# Patient Record
Sex: Male | Born: 2000 | Race: Asian | Hispanic: No | Marital: Single | Smoking: Never smoker
Health system: Southern US, Community
[De-identification: ages and names within clinical notes are randomized; demographics above are authoritative.]

## PROBLEM LIST (undated history)

## (undated) DIAGNOSIS — M2550 Pain in unspecified joint: Secondary | ICD-10-CM

## (undated) DIAGNOSIS — R059 Cough, unspecified: Secondary | ICD-10-CM

## (undated) DIAGNOSIS — G43909 Migraine, unspecified, not intractable, without status migrainosus: Secondary | ICD-10-CM

## (undated) DIAGNOSIS — J209 Acute bronchitis, unspecified: Secondary | ICD-10-CM

## (undated) DIAGNOSIS — J069 Acute upper respiratory infection, unspecified: Secondary | ICD-10-CM

## (undated) DIAGNOSIS — F419 Anxiety disorder, unspecified: Secondary | ICD-10-CM

## (undated) DIAGNOSIS — R05 Cough: Secondary | ICD-10-CM

## (undated) HISTORY — DX: Pain in unspecified joint: M25.50

## (undated) HISTORY — DX: Acute upper respiratory infection, unspecified: J06.9

## (undated) HISTORY — DX: Cough, unspecified: R05.9

## (undated) HISTORY — DX: Cough: R05

## (undated) HISTORY — DX: Acute bronchitis, unspecified: J20.9

---
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
## 2019-01-08 DIAGNOSIS — R51 Headache: Secondary | ICD-10-CM | POA: Diagnosis not present

## 2019-01-08 DIAGNOSIS — R200 Anesthesia of skin: Secondary | ICD-10-CM | POA: Diagnosis not present

## 2019-01-31 DIAGNOSIS — R202 Paresthesia of skin: Secondary | ICD-10-CM | POA: Diagnosis not present

## 2019-01-31 DIAGNOSIS — R51 Headache: Secondary | ICD-10-CM | POA: Diagnosis not present

## 2019-01-31 DIAGNOSIS — R200 Anesthesia of skin: Secondary | ICD-10-CM | POA: Diagnosis not present

## 2019-02-07 NOTE — Telephone Encounter (Signed)
Message sent to provider 

## 2019-02-14 DIAGNOSIS — R200 Anesthesia of skin: Secondary | ICD-10-CM | POA: Diagnosis not present

## 2019-02-14 DIAGNOSIS — M79641 Pain in right hand: Secondary | ICD-10-CM | POA: Diagnosis not present

## 2019-02-14 DIAGNOSIS — G4452 New daily persistent headache (NDPH): Secondary | ICD-10-CM | POA: Diagnosis not present

## 2019-02-14 DIAGNOSIS — M79642 Pain in left hand: Secondary | ICD-10-CM | POA: Diagnosis not present

## 2019-02-15 ENCOUNTER — Other Ambulatory Visit: Payer: Self-pay | Admitting: Neurology

## 2019-02-15 DIAGNOSIS — G4452 New daily persistent headache (NDPH): Secondary | ICD-10-CM

## 2019-02-20 ENCOUNTER — Other Ambulatory Visit: Payer: Self-pay

## 2019-02-20 ENCOUNTER — Ambulatory Visit (HOSPITAL_COMMUNITY)
Admission: RE | Admit: 2019-02-20 | Discharge: 2019-02-20 | Disposition: A | Discharge status: Home / Self Care | Payer: No Typology Code available for payment source | Source: Ambulatory Visit | Attending: Neurology | Admitting: Neurology

## 2019-02-20 DIAGNOSIS — G4452 New daily persistent headache (NDPH): Secondary | ICD-10-CM | POA: Diagnosis not present

## 2019-02-20 DIAGNOSIS — R51 Headache: Secondary | ICD-10-CM | POA: Diagnosis not present

## 2019-04-22 DIAGNOSIS — R202 Paresthesia of skin: Secondary | ICD-10-CM | POA: Diagnosis not present

## 2019-04-22 DIAGNOSIS — R51 Headache: Secondary | ICD-10-CM | POA: Diagnosis not present

## 2019-04-22 DIAGNOSIS — R200 Anesthesia of skin: Secondary | ICD-10-CM | POA: Diagnosis not present

## 2019-05-27 ENCOUNTER — Encounter: Payer: Self-pay | Admitting: Family Medicine

## 2019-05-27 ENCOUNTER — Ambulatory Visit (INDEPENDENT_AMBULATORY_CARE_PROVIDER_SITE_OTHER): Payer: Medicaid Other | Admitting: Family Medicine

## 2019-05-27 ENCOUNTER — Other Ambulatory Visit: Payer: Self-pay

## 2019-05-27 VITALS — BP 120/68 | HR 89 | Temp 98.7°F | Ht 65.0 in | Wt 142.8 lb

## 2019-05-27 DIAGNOSIS — R202 Paresthesia of skin: Secondary | ICD-10-CM | POA: Diagnosis not present

## 2019-05-27 DIAGNOSIS — Z23 Encounter for immunization: Secondary | ICD-10-CM

## 2019-05-27 DIAGNOSIS — M791 Myalgia, unspecified site: Secondary | ICD-10-CM

## 2019-05-27 DIAGNOSIS — M2550 Pain in unspecified joint: Secondary | ICD-10-CM | POA: Diagnosis not present

## 2019-05-27 DIAGNOSIS — Z09 Encounter for follow-up examination after completed treatment for conditions other than malignant neoplasm: Secondary | ICD-10-CM

## 2019-05-27 DIAGNOSIS — R200 Anesthesia of skin: Secondary | ICD-10-CM

## 2019-05-27 NOTE — Progress Notes (Signed)
[facility]   Established Patient Office Visit  Subjective:  Patient ID: Phillip Hopkins, male    DOB: December 11, 2000  Age: 18 y.o. MRN: [id number]  CC:  Chief Complaint  Patient presents with  . Follow-up    6 month follow up     HPI Phillip BinetJunho Tates is a 18 year old male who presents for Follow Up today.   Past Medical History:  Diagnosis Date  . Acute bronchitis   . Cough   . Generalized joint pain   . Upper respiratory tract infection    Current Status: Since his last office visit, he has c/o generalized joint pain. He is currently seeing Neurologist. He continues to have throat irritation. He denies fevers, chills, fatigue, recent infections, weight loss, and night sweats. He has not had any headaches, visual changes, dizziness, and falls. No chest pain, heart palpitations, cough and shortness of breath reported. No reports of GI problems such as nausea, vomiting, diarrhea, and constipation. He has no reports of blood in stools, dysuria and hematuria. His anxiety is mild today. He denies suicidal ideations, homicidal ideations, or auditory hallucinations.  No past surgical history on file.  No family history on file.  Social History   Socioeconomic History  . Marital status: Single    Spouse name: Not on file  . Number of children: Not on file  . Years of education: Not on file  . Highest education level: Not on file  Occupational History  . Not on file  Social Needs  . Financial resource strain: Not on file  . Food insecurity    Worry: Not on file    Inability: Not on file  . Transportation needs    Medical: Not on file    Non-medical: Not on file  Tobacco Use  . Smoking status: Never Smoker  . Smokeless tobacco: Never Used  Substance and Sexual Activity  . Alcohol use: Never    Frequency: Never  . Drug use: Never  . Sexual activity: Not on file  Lifestyle  . Physical activity    Days per week: Not on file    Minutes per  session: Not on file  . Stress: Not on file  Relationships  . Social Musicianconnections    Talks on phone: Not on file    Gets together: Not on file    Attends religious service: Not on file    Active member of club or organization: Not on file    Attends meetings of clubs or organizations: Not on file    Relationship status: Not on file  . Intimate partner violence    Fear of current or ex partner: Not on file    Emotionally abused: Not on file    Physically abused: Not on file    Forced sexual activity: Not on file  Other Topics Concern  . Not on file  Social History Narrative  . Not on file    Outpatient Medications Prior to Visit  Medication Sig Dispense Refill  . nortriptyline (PAMELOR) 10 MG capsule Take 20 mg at night    . venlafaxine (EFFEXOR) 37.5 MG tablet Take by mouth.    Marland Kitchen. albuterol (PROVENTIL HFA;VENTOLIN HFA) 108 (90 Base) MCG/ACT inhaler 2 puffs.    . benzonatate (TESSALON) 100 MG capsule Take 1 capsule (100 mg total) by mouth 2 (two) times daily as needed for cough. (Patient not taking: Reported on 05/27/2019) 20 capsule 0  . cetirizine (ZYRTEC) 10 MG  tablet Take 10 mg by mouth.    Marland Kitchen. ibuprofen (ADVIL,MOTRIN) 800 MG tablet Take 1 tablet (800 mg total) by mouth every 8 (eight) hours as needed. (Patient not taking: Reported on 05/27/2019) 30 tablet 0  . montelukast (SINGULAIR) 10 MG tablet Take 10 mg by mouth.     No facility-administered medications prior to visit.     No Known Allergies  ROS Review of Systems  Constitutional: Negative.   HENT: Negative.   Eyes: Negative.   Respiratory: Negative.   Cardiovascular: Negative.   Gastrointestinal: Negative.   Endocrine: Negative.   Genitourinary: Negative.   Musculoskeletal: Positive for arthralgias (generalized joint pain).  Skin: Negative.   Allergic/Immunologic: Negative.   Neurological: Negative.   Hematological: Negative.   Psychiatric/Behavioral: Negative.    Objective:    Physical Exam  Constitutional:  He is oriented to person, place, and time. He appears well-developed and well-nourished.  HENT:  Head: Normocephalic and atraumatic.  Neck: Normal range of motion. Neck supple.  Cardiovascular: Normal rate, regular rhythm, normal heart sounds and intact distal pulses.  Pulmonary/Chest: Effort normal and breath sounds normal.  Abdominal: Soft. Bowel sounds are normal.  Musculoskeletal: Normal range of motion.  Neurological: He is alert and oriented to person, place, and time. He has normal reflexes.  Skin: Skin is dry.  Psychiatric: He has a normal mood and affect. His behavior is normal. Judgment and thought content normal.  Nursing note and vitals reviewed.   BP 120/68 (BP Location: Left Arm, Patient Position: Sitting, Cuff Size: Normal)   Pulse 89   Temp 98.7 F (37.1 C) (Oral)   Ht 5\' 5"  (1.651 m)   Wt 142 lb 12.8 oz (64.8 kg)   BMI 23.76 kg/m  Wt Readings from Last 3 Encounters:  05/27/19 142 lb 12.8 oz (64.8 kg) (32 %, Z= -0.47)*  11/26/18 146 lb (66.2 kg) (41 %, Z= -0.24)*  10/31/18 145 lb 12.8 oz (66.1 kg) (41 %, Z= -0.23)*   * Growth percentiles are based on CDC (Boys, 2-20 Years) data.     Health Maintenance Due  Topic Date Due  . HIV Screening  02/12/2015  . TETANUS/TDAP  02/12/2019    There are no preventive care reminders to display for this patient.  Lab Results  Component Value Date   TSH 4.400 11/19/2018   Lab Results  Component Value Date   WBC 4.6 10/26/2018   HGB 15.1 10/26/2018   HCT 44.2 10/26/2018   MCV 87 10/26/2018   PLT 253 10/26/2018   Lab Results  Component Value Date   NA 141 10/26/2018   K 4.5 10/26/2018   CO2 23 10/26/2018   GLUCOSE 93 10/26/2018   BUN 10 10/26/2018   CREATININE 0.94 10/26/2018   BILITOT 0.7 10/26/2018   ALKPHOS 80 10/26/2018   AST 32 10/26/2018   ALT 59 (H) 10/26/2018   PROT 7.2 10/26/2018   ALBUMIN 5.1 10/26/2018   CALCIUM 9.8 10/26/2018   No results found for: CHOL No results found for: HDL No  results found for: LDLCALC No results found for: TRIG No results found for: [facility] Lab Results  Component Value Date   HGBA1C 5.3 10/26/2018   Assessment & Plan:   1. Need for influenza vaccination - Flu Vaccine QUAD 6+ mos PF IM (Fluarix Quad PF)  2. Generalized joint pain  3. Generalized muscle ache  4. Numbness and tingling of left upper and lower extremity  5. Follow up He will follow up ijn 6 months.  No orders of the defined types were placed in this encounter.   Orders Placed This Encounter  Procedures  . Flu Vaccine QUAD 6+ mos PF IM (Fluarix Quad PF)    Referral Orders  No referral(s) requested today    Kathe Becton,  MSN, FNP-BC [facility], [place] [address] [phone] fax   Problem List Items Addressed This Visit      Other   Generalized joint pain   Generalized muscle ache   Numbness and tingling of left upper and lower extremity    Other Visit Diagnoses    Need for influenza vaccination    -  Primary   Relevant Orders   Flu Vaccine QUAD 6+ mos PF IM (Fluarix Quad PF) (Completed)   Follow up          No orders of the defined types were placed in this encounter.   Follow-up: Return in about 6 months (around 11/27/2019).    Azzie Glatter, FNP

## 2019-05-30 DIAGNOSIS — R768 Other specified abnormal immunological findings in serum: Secondary | ICD-10-CM | POA: Diagnosis not present

## 2019-05-30 DIAGNOSIS — R200 Anesthesia of skin: Secondary | ICD-10-CM | POA: Diagnosis not present

## 2019-05-30 DIAGNOSIS — R202 Paresthesia of skin: Secondary | ICD-10-CM | POA: Diagnosis not present

## 2019-06-03 DIAGNOSIS — R51 Headache: Secondary | ICD-10-CM | POA: Diagnosis not present

## 2019-06-03 DIAGNOSIS — R200 Anesthesia of skin: Secondary | ICD-10-CM | POA: Diagnosis not present

## 2019-06-03 DIAGNOSIS — R202 Paresthesia of skin: Secondary | ICD-10-CM | POA: Diagnosis not present

## 2019-06-20 ENCOUNTER — Telehealth: Payer: Self-pay

## 2019-06-20 NOTE — Telephone Encounter (Signed)
Received a call from pt ,He said that he got an call from  [place] [phone] and said for him to call us about setting up an appt for him at  [facility] because he has medicaid . Called spoke with [facility] neurology  Set up an appt for patient 08/14/19 @1pm  with Dr.Paul Aredes . Phone [phone]. [address] [facility] [address] [facility] [address] [place] . Pt is aware of this appt. Pt has also been add to waiting list, inform pt if this appt didn't work for him or if him wanted to change it to tele visit or e-visit to call their to do so.

## 2019-06-21 ENCOUNTER — Encounter: Payer: Self-pay | Admitting: Physical Therapy

## 2019-06-21 ENCOUNTER — Ambulatory Visit: Payer: Medicaid Other | Attending: Neurology | Admitting: Physical Therapy

## 2019-06-21 ENCOUNTER — Other Ambulatory Visit: Payer: Self-pay

## 2019-06-21 DIAGNOSIS — M542 Cervicalgia: Secondary | ICD-10-CM | POA: Insufficient documentation

## 2019-06-21 DIAGNOSIS — G8929 Other chronic pain: Secondary | ICD-10-CM | POA: Diagnosis not present

## 2019-06-21 DIAGNOSIS — M791 Myalgia, unspecified site: Secondary | ICD-10-CM | POA: Diagnosis not present

## 2019-06-21 DIAGNOSIS — R200 Anesthesia of skin: Secondary | ICD-10-CM | POA: Insufficient documentation

## 2019-06-21 DIAGNOSIS — R262 Difficulty in walking, not elsewhere classified: Secondary | ICD-10-CM | POA: Diagnosis not present

## 2019-06-21 DIAGNOSIS — M2550 Pain in unspecified joint: Secondary | ICD-10-CM

## 2019-06-21 DIAGNOSIS — R202 Paresthesia of skin: Secondary | ICD-10-CM | POA: Diagnosis not present

## 2019-06-21 NOTE — Therapy (Addendum)
[facility] [address] [place], [place], [address] Phone: [phone]   Fax:  [phone]  Physical Therapy Evaluation  Patient Details  Name: Phillip Hopkins MRN: [id number] Date of Birth: 2000-12-22 Referring Provider (PT): Dr. Melrose Nakayama   Encounter Date: 06/21/2019  PT End of Session - 06/26/19 1149    Visit Number  1    Number of Visits  1    Date for PT Re-Evaluation  06/21/19    PT Start Time  0846    PT Stop Time  1011    PT Time Calculation (min)  85 min       Past Medical History:  Diagnosis Date  . Acute bronchitis   . Cough   . Generalized joint pain   . Upper respiratory tract infection     History reviewed. No pertinent surgical history.  There were no vitals filed for this visit.                 Plan - 06/26/19 1151    Clinical Impression Statement  See FCE report.  Pt. presents with generalized pain in mulitple joints limiting all aspects of functional capacity evaluation.  Pt. reports 5-6/10 neck/ B shoulder pain prior to evalation with a marked increase to 8-9/10 pain in varying joints/ muscles as evaluation progresses with dynamic lifting/ position tolerance testing.  Pt. very pain focused/ limited during strength testing/ dynamic lifting tasks.  Pt. ambulates with a varying antalgic gait pattern t/o evaluation with increase R knee/L hip pain and flexed posture.  Due to limited nature and self-limiting behavior during test, unable to determine overall level of work or tolerance for an 8-hour day.    Stability/Clinical Decision Making  Unstable/Unpredictable    Clinical Decision Making  High    Rehab Potential  Fair    PT Frequency  1x / week    PT Treatment/Interventions  ADLs/Self Care Home Management;Gait training;Stair training;Functional mobility training;Therapeutic activities;Therapeutic exercise;Balance training;Neuromuscular re-education;Manual techniques;Passive range of motion    PT Next Visit  Plan  FCE only       Patient will benefit from skilled therapeutic intervention in order to improve the following deficits and impairments:  Abnormal gait, Decreased activity tolerance, Decreased endurance, Decreased range of motion, Decreased strength, Impaired UE functional use, Improper body mechanics, Postural dysfunction, Impaired flexibility, Pain, Decreased balance, Difficulty walking  Visit Diagnosis: Generalized muscle ache  Chronic neck pain  Generalized joint pain  Numbness and tingling of left upper and lower extremity  Difficulty in walking, not elsewhere classified     Problem List Patient Active Problem List   Diagnosis Date Noted  . Generalized muscle ache 11/26/2018  . Chronic neck pain 11/26/2018  . Generalized joint pain 10/31/2018  . Acute otitis media 10/31/2018  . Numbness and tingling of left upper and lower extremity 10/26/2018   Pura Spice, PT, DPT # [id number] 06/27/2019, 11:09 AM  [facility] [address] [place], [place], [address] Phone: [phone]   Fax:  [phone]  Name: [name] MRN: [id number] Date of Birth: 2001/05/16

## 2019-06-27 NOTE — Addendum Note (Signed)
Addended by: Dorcas Carrow C on: 06/27/2019 11:12 AM   Modules accepted: Orders

## 2019-07-02 DIAGNOSIS — G629 Polyneuropathy, unspecified: Secondary | ICD-10-CM | POA: Diagnosis not present

## 2019-07-02 DIAGNOSIS — R079 Chest pain, unspecified: Secondary | ICD-10-CM | POA: Diagnosis not present

## 2019-07-08 ENCOUNTER — Other Ambulatory Visit: Payer: Self-pay

## 2019-07-08 ENCOUNTER — Encounter (HOSPITAL_COMMUNITY): Payer: Self-pay | Admitting: *Deleted

## 2019-07-08 ENCOUNTER — Emergency Department (HOSPITAL_COMMUNITY)
Admission: EM | Admit: 2019-07-08 | Discharge: 2019-07-08 | Disposition: A | Discharge status: Home / Self Care | Payer: Medicaid Other | Attending: Emergency Medicine | Admitting: Emergency Medicine

## 2019-07-08 ENCOUNTER — Emergency Department (HOSPITAL_COMMUNITY): Payer: Medicaid Other

## 2019-07-08 DIAGNOSIS — G43009 Migraine without aura, not intractable, without status migrainosus: Secondary | ICD-10-CM | POA: Diagnosis not present

## 2019-07-08 DIAGNOSIS — R519 Headache, unspecified: Secondary | ICD-10-CM | POA: Diagnosis present

## 2019-07-08 DIAGNOSIS — R079 Chest pain, unspecified: Secondary | ICD-10-CM | POA: Diagnosis not present

## 2019-07-08 DIAGNOSIS — G43809 Other migraine, not intractable, without status migrainosus: Secondary | ICD-10-CM | POA: Diagnosis not present

## 2019-07-08 HISTORY — DX: Migraine, unspecified, not intractable, without status migrainosus: G43.909

## 2019-07-08 LAB — BASIC METABOLIC PANEL
Anion gap: 12 (ref 5–15)
BUN: 15 mg/dL (ref 6–20)
CO2: 25 mmol/L (ref 22–32)
Calcium: 9.7 mg/dL (ref 8.9–10.3)
Chloride: 102 mmol/L (ref 98–111)
Creatinine, Ser: 1.12 mg/dL (ref 0.61–1.24)
GFR calc Af Amer: >60 mL/min (ref >60)
GFR calc non Af Amer: >60 mL/min (ref >60)
Glucose, Bld: 91 mg/dL (ref 70–99)
Potassium: 4.3 mmol/L (ref 3.5–5.1)
Sodium: 139 mmol/L (ref 135–145)

## 2019-07-08 LAB — CBC
HCT: 49 % (ref 39.0–52.0)
Hemoglobin: 17 g/dL (ref 13.0–17.0)
MCH: 30.6 pg (ref 26.0–34.0)
MCHC: 34.7 g/dL (ref 30.0–36.0)
MCV: 88.3 fL (ref 80.0–100.0)
Platelets: 255 10*3/uL (ref 150–400)
RBC: 5.55 MIL/uL (ref 4.22–5.81)
RDW: 11.7 % (ref 11.5–15.5)
WBC: 9.3 10*3/uL (ref 4.0–10.5)
nRBC: 0 % (ref 0.0–0.2)

## 2019-07-08 LAB — TROPONIN I (HIGH SENSITIVITY): Troponin I (High Sensitivity): 3 ng/L (ref <18)

## 2019-07-08 MED ORDER — SODIUM CHLORIDE 0.9% FLUSH: 3.0000 mL | Freq: Once | INTRAVENOUS | Status: DC

## 2019-07-08 MED ORDER — DEXAMETHASONE 4 MG PO TABS
10.0000 mg | ORAL_TABLET | Freq: Once | ORAL | Status: AC
  Administered 2019-07-08: 10 mg via ORAL
  Filled 2019-07-08: qty 3

## 2019-07-08 MED ORDER — PROCHLORPERAZINE EDISYLATE 10 MG/2ML IJ SOLN
10.0000 mg | Freq: Once | INTRAMUSCULAR | Status: AC
  Administered 2019-07-08: 10 mg via INTRAMUSCULAR
  Filled 2019-07-08: qty 2

## 2019-07-08 MED ORDER — DIPHENHYDRAMINE HCL 50 MG/ML IJ SOLN
25.0000 mg | Freq: Once | INTRAMUSCULAR | Status: AC
  Administered 2019-07-08: 25 mg via INTRAMUSCULAR
  Filled 2019-07-08: qty 1

## 2019-07-08 NOTE — ED Triage Notes (Addendum)
Pt has been seeing a neurologist for several months for migraines and bil arm numbness.  He has tested neg for ms, etc.  Today he experienced chest pain and his arms went cold.  Episode lasted several minutes.  Pt is also having migraines.  He has taken all his migraine meds without any relief yesterday or today.

## 2019-07-08 NOTE — ED Provider Notes (Signed)
[facility] EMERGENCY DEPARTMENT Provider Note   CSN: [id number] Arrival date & time: 07/08/19  1416     History   Chief Complaint Chief Complaint  Patient presents with  . Chest Pain  . Dizziness    HPI Phillip Hopkins is a 18 y.o. male.     18 yo M with a chief complaint of a headache and bilateral arm tingling.  This been going on for about 3 days now.  Patient has a history of the same and has been seen by a neurologist and a rheumatologist.  He has had an extensive work-up that thus far has been unremarkable.  He thinks that the headache feels typical of his same however is lasted longer than normal.  Also feels that the tingling to his hands is lasted longer than normal.  He does have neck pain with this.  Denies any trauma to the head or neck.   Chest Pain Associated symptoms: dizziness   Associated symptoms: no abdominal pain, no fever, no headache, no palpitations, no shortness of breath and no vomiting   Dizziness Associated symptoms: chest pain   Associated symptoms: no diarrhea, no headaches, no palpitations, no shortness of breath and no vomiting     Past Medical History:  Diagnosis Date  . Acute bronchitis   . Cough   . Generalized joint pain   . Migraines   . Upper respiratory tract infection     Patient Active Problem List   Diagnosis Date Noted  . Generalized muscle ache 11/26/2018  . Chronic neck pain 11/26/2018  . Generalized joint pain 10/31/2018  . Acute otitis media 10/31/2018  . Numbness and tingling of left upper and lower extremity 10/26/2018    History reviewed. No pertinent surgical history.      Home Medications    Prior to Admission medications   Medication Sig Start Date End Date Taking? Authorizing Provider  nortriptyline (PAMELOR) 10 MG capsule Take 20 mg by mouth at bedtime.  04/22/19  Yes [provider]  venlafaxine (EFFEXOR) 37.5 MG tablet Take by mouth. 05/21/19 05/20/20 Yes [provider]    Family History No family history on file.  Social History Social History   Tobacco Use  . Smoking status: Never Smoker  . Smokeless tobacco: Never Used  Substance Use Topics  . Alcohol use: Never    Frequency: Never  . Drug use: Never     Allergies   Patient has no known allergies.   Review of Systems Review of Systems  Constitutional: Negative for chills and fever.  HENT: Negative for congestion and facial swelling.   Eyes: Negative for discharge and visual disturbance.  Respiratory: Negative for shortness of breath.   Cardiovascular: Positive for chest pain. Negative for palpitations.  Gastrointestinal: Negative for abdominal pain, diarrhea and vomiting.  Musculoskeletal: Positive for neck pain. Negative for arthralgias and myalgias.  Skin: Negative for color change and rash.  Neurological: Positive for dizziness. Negative for tremors, syncope and headaches.  Psychiatric/Behavioral: Negative for confusion and dysphoric mood.     Physical Exam Updated Vital Signs BP (!) 135/96 (BP Location: Right Arm)   Pulse 87   Temp 99.5 F (37.5 C) (Oral)   Resp (!) 108   Ht 5\' 5"  (1.651 m)   Wt 63.5 kg   SpO2 100%   BMI 23.30 kg/m   Physical Exam Vitals signs and nursing note reviewed.  Constitutional:      Appearance: He is well-developed.  HENT:  Head: Normocephalic and atraumatic.  Eyes:     Pupils: Pupils are equal, round, and reactive to light.  Neck:     Musculoskeletal: Normal range of motion and neck supple.     Vascular: No JVD.  Cardiovascular:     Rate and Rhythm: Normal rate and regular rhythm.     Heart sounds: No murmur. No friction rub. No gallop.   Pulmonary:     Effort: No respiratory distress.     Breath sounds: No wheezing.  Abdominal:     General: There is no distension.     Tenderness: There is no guarding or rebound.  Musculoskeletal: Normal range of motion.  Skin:    Coloration: Skin is not pale.     Findings: No rash.   Neurological:     Mental Status: He is alert and oriented to person, place, and time.     Cranial Nerves: Cranial nerves are intact.     Sensory: Sensation is intact.     Motor: Motor function is intact.     Coordination: Coordination is intact.     Gait: Gait is intact.     Comments: Benign neurologic exam.  Pulse motor and sensation are intact to bilateral upper extremities.  Psychiatric:        Behavior: Behavior normal.      ED Treatments / Results  Labs (all labs ordered are listed, but only abnormal results are displayed) Labs Reviewed  BASIC METABOLIC PANEL  CBC  TROPONIN I (HIGH SENSITIVITY)    EKG EKG Interpretation  Date/Time:  Monday July 08 2019 14:25:30 EDT Ventricular Rate:  105 PR Interval:  120 QRS Duration: 70 QT Interval:  304 QTC Calculation: 401 R Axis:   81 Text Interpretation:  Sinus tachycardia Otherwise normal ECG Since last tracing rate faster Otherwise no significant change Confirmed by Melene PlanFloyd, Lovely Kerins [phone]([id number]) on 07/08/2019 5:16:06 PM   Radiology Dg Chest 2 View  Result Date: 07/08/2019 CLINICAL DATA:  Chest pain. EXAM: CHEST - 2 VIEW COMPARISON:  None. FINDINGS: The heart size and mediastinal contours are within normal limits. Both lungs are clear. The visualized skeletal structures are unremarkable. IMPRESSION: No active cardiopulmonary disease. Electronically Signed   By: Gerome Samavid  Williams III M.D   On: 07/08/2019 14:48    Procedures Procedures (including critical care time)  Medications Ordered in ED Medications  sodium chloride flush (NS) 0.9 % injection 3 mL (3 mLs Intravenous Not Given 07/08/19 1804)  prochlorperazine (COMPAZINE) injection 10 mg (10 mg Intramuscular Given 07/08/19 1728)  diphenhydrAMINE (BENADRYL) injection 25 mg (25 mg Intramuscular Given 07/08/19 1728)  dexamethasone (DECADRON) tablet 10 mg (10 mg Oral Given 07/08/19 1727)     Initial Impression / Assessment and Plan / ED Course  I have reviewed the triage vital signs  and the nursing notes.  Pertinent labs & imaging results that were available during my care of the patient were reviewed by me and considered in my medical decision making (see chart for details).        18 yo M with a chief complaint of headaches and bilateral upper extremity tingling.  This is been an ongoing issue for him.  He [facility] neurolog[facility]  He is currently on amitriptyline and Effexor without improvement.  Denies any missed doses.  Denies new trauma.  He has no deficit that is appreciable on my exam.  He arrived complaining of some mild chest discomfort which typically coincides with his  headache symptoms.  Had a work-up ordered in triage that is thus far unremarkable.  Will give a headache cocktail and reassess.  Patient's laboratory work-up ordered in triage for chest pain is unremarkable.  Troponin is negative no electrolyte abnormality hemoglobin is normal.  Patient's headache and hand symptoms have improved significantly.  We will discharge the patient home.  The patient was tachycardic upon arrival which improved post headache cocktail.  The patient has a respiratory rate charted of 108 which is not correct.  My manual count of his respiratory rate is 13.  Discharge home.  6:44 PM:  I have discussed the diagnosis/risks/treatment options with the patient and believe the pt to be eligible for discharge home to follow-up with Neuro. We also discussed returning to the ED immediately if new or worsening sx occur. We discussed the sx which are most concerning (e.g., sudden worsening pain, fever, inability to tolerate by mouth) that necessitate immediate return. Medications administered to the patient during their visit and any new prescriptions provided to the patient are listed below.  Medications given during this visit Medications  sodium chloride flush (NS) 0.9 % injection 3 mL (3 mLs Intravenous Not Given 07/08/19 1804)  prochlorperazine  (COMPAZINE) injection 10 mg (10 mg Intramuscular Given 07/08/19 1728)  diphenhydrAMINE (BENADRYL) injection 25 mg (25 mg Intramuscular Given 07/08/19 1728)  dexamethasone (DECADRON) tablet 10 mg (10 mg Oral Given 07/08/19 1727)     The patient appears reasonably screen and/or stabilized for discharge and I doubt any other medical condition or other [facility] requiring further screening, evaluation, or treatment in the ED at this time prior to discharge.    Final Clinical Impressions(s) / ED Diagnoses   Final diagnoses:  Atypical migraine    ED Discharge Orders    None       Melene Plan, DO 07/08/19 1844

## 2019-07-08 NOTE — ED Notes (Signed)
Patient verbalizes understanding of discharge instructions. Opportunity for questioning and answers were provided. Armband removed by staff, pt discharged from ED.  

## 2019-07-08 NOTE — Discharge Instructions (Signed)
Please follow-up with your neurologist.  Return to the ED for worsening symptoms, fever.

## 2019-07-15 DIAGNOSIS — M542 Cervicalgia: Secondary | ICD-10-CM | POA: Diagnosis not present

## 2019-07-15 DIAGNOSIS — R200 Anesthesia of skin: Secondary | ICD-10-CM | POA: Diagnosis not present

## 2019-07-15 DIAGNOSIS — R202 Paresthesia of skin: Secondary | ICD-10-CM | POA: Diagnosis not present

## 2019-07-16 ENCOUNTER — Encounter: Payer: Self-pay | Admitting: Family Medicine

## 2019-07-16 ENCOUNTER — Other Ambulatory Visit: Payer: Self-pay | Admitting: Family Medicine

## 2019-07-16 DIAGNOSIS — F419 Anxiety disorder, unspecified: Secondary | ICD-10-CM

## 2019-07-25 ENCOUNTER — Telehealth: Payer: Self-pay

## 2019-07-25 NOTE — Telephone Encounter (Signed)
Patients Mother & Father are requesting an new referral for Rheumatology. They would like the son seen by Dr. Bo Merino. Will you please place the referral for STAT?  Per parents his symptoms are getting worse.   He is currently being seen at [facility]  in Rheumatology & Neurology.

## 2019-07-25 NOTE — Telephone Encounter (Signed)
Patient and Mother is aware of need for Phych follow up. They will call [facility] for follow up.

## 2019-07-29 ENCOUNTER — Ambulatory Visit (INDEPENDENT_AMBULATORY_CARE_PROVIDER_SITE_OTHER): Payer: Medicaid Other | Admitting: Licensed Clinical Social Worker

## 2019-07-29 ENCOUNTER — Other Ambulatory Visit: Payer: Self-pay

## 2019-07-29 ENCOUNTER — Encounter (HOSPITAL_COMMUNITY): Payer: Self-pay | Admitting: Licensed Clinical Social Worker

## 2019-07-29 DIAGNOSIS — R52 Pain, unspecified: Secondary | ICD-10-CM

## 2019-07-29 DIAGNOSIS — F0634 Mood disorder due to known physiological condition with mixed features: Secondary | ICD-10-CM | POA: Diagnosis not present

## 2019-07-29 NOTE — Progress Notes (Signed)
Virtual Visit via Video Note  I connected with Phillip Hopkins on 07/29/19 at 10:00 AM EDT by a video enabled telemedicine application and verified that I am speaking with the correct person using two identifiers.    I discussed the limitations of evaluation and management by telemedicine and the availability of in person appointments. The patient expressed understanding and agreed to proceed.    I discussed the assessment and treatment plan with the patient. The patient was provided an opportunity to ask questions and all were answered. The patient agreed with the plan and demonstrated an understanding of the instructions.   The patient was advised to call back or seek an in-person evaluation if the symptoms worsen or if the condition fails to improve as anticipated.  I provided 55 minutes of non-face-to-face time during this encounter.   Phillip Curling, LCSW   Comprehensive Clinical Assessment (CCA) Note  07/29/2019 Phillip Hopkins [id number]  Visit Diagnosis:      ICD-10-CM   1. 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  (See scanned document in Chart Review)  CCA Part Two A  Intake/Chief Complaint:  CCA Intake With Chief Complaint CCA Part Two Date: 07/29/19 CCA Part Two Time: 1005 Chief Complaint/Presenting Problem: Since January, had pain in limbs, pain behind eyes, neck, headaches. Patients Currently Reported Symptoms/Problems: feeling stressed in general, has been in pain and sick for almost a year. Pain gets worse if he thinks about it. Collateral Involvement: father, step mother, has some high school friends. Individual's Strengths: in college, logic based thinking, trustworthy, reliable, I help people by listening Individual's Preferences: plays guitar, piano, cycling Individual's Abilities: musical Initial Clinical Notes/Concerns: pain disorder, no diagnosis  as to why he experiences this pain Pain, dry cough, since November 2019.  Mental Health Symptoms Depression:  Depression: Fatigue, Difficulty Concentrating, Irritability, Sleep (too much or little), Hopelessness  Mania:  Mania: N/A  Anxiety:   Anxiety: Worrying, Sleep, Difficulty concentrating  Psychosis:  Psychosis: N/A  Trauma:  Trauma: N/A  Obsessions:  Obsessions: N/A  Compulsions:  Compulsions: N/A  Inattention:  Inattention: N/A  Hyperactivity/Impulsivity:  Hyperactivity/Impulsivity: N/A  Oppositional/Defiant Behaviors:  Oppositional/Defiant Behaviors: Easily annoyed  Borderline Personality:  Emotional Irregularity: N/A  Other Mood/Personality Symptoms:   only experiencing depression due to pain that is undiagnosed at this time.    Mental Status Exam Appearance and self-care  Stature:  Stature: Average  Weight:  Weight: Average weight  Clothing:  Clothing: Casual  Grooming:  Grooming: Normal  Cosmetic use:  Cosmetic Use: None  Posture/gait:  Posture/Gait: Normal  Motor activity:  Motor Activity: Slowed  Sensorium  Attention:  Attention: Normal  Concentration:  Concentration: Normal  Orientation:  Orientation: X5  Recall/memory:  Recall/Memory: Defective in immediate, Normal  Affect and Mood  Affect:  Affect: Depressed, Flat  Mood:  Mood: Depressed  Relating  Eye contact:  Eye Contact: Normal  Facial expression:  Facial Expression: Depressed  Attitude toward examiner:  Attitude Toward Examiner: Cooperative  Thought and Language  Speech flow: Speech Flow: Pressured, Soft  Thought content:  Thought Content: Appropriate to mood and circumstances  Preoccupation:   NA  Hallucinations:   NA  Organization:   logical  Transport planner of Knowledge:  Fund of Knowledge: Average  Intelligence:  Intelligence: Above Average  Abstraction:  Abstraction: Normal  Judgement:  Judgement:  Normal  Reality Testing:  Reality Testing: Realistic  Insight:  Insight: Good   Decision Making:  Decision Making: Normal  Social Functioning  Social Maturity:  Social Maturity: Responsible  Social Judgement:  Social Judgement: Normal  Stress  Stressors:  Stressors: Illness, Work(school, unable to do schoolwork because of pain issues)  Coping Ability:  Coping Ability: Building surveyorverwhelmed  Skill Deficits:   NA  Supports:   father, step mother   Family and Psychosocial History: Family history Marital status: Single Are you sexually active?: No What is your sexual orientation?: bisexual Has your sexual activity been affected by drugs, alcohol, medication, or emotional stress?: no Does patient have children?: No  Childhood History:  Childhood History By whom was/is the patient raised?: Both parents Additional childhood history information: Mother died in 2008 when [place] was 7. Step mom came into his life at age 89. Okay since 2014 with losing mom. Description of patient's relationship with caregiver when they were a child: close with parents, "normal relationship" Patient's description of current relationship with people who raised him/her: "normal", closer to father How were you disciplined when you got in trouble as a child/adolescent?: verbal, occasional smack on the wrist with a ruler Does patient have siblings?: Yes Number of Siblings: 1 Description of patient's current relationship with siblings: younger sister, age 764. She's okay, occasionally annoyed with her. Sometimes Florian BuffJunho will take care of her. Did patient suffer any verbal/emotional/physical/sexual abuse as a child?: No Did patient suffer from severe childhood neglect?: No Has patient ever been sexually abused/assaulted/raped as an adolescent or adult?: No Was the patient ever a victim of a crime or a disaster?: No Witnessed domestic violence?: No Has patient been effected by domestic violence as an adult?: No  CCA Part Two B  Employment/Work Situation: Employment / Work Psychologist, occupationalituation Employment situation:  Surveyor, mineralstudent Patient's job has been impacted by current illness: Yes Describe how patient's job has been impacted: school has been impacted, has to drop classed due to illness Did You Receive Any Psychiatric Treatment/Services While in the [place]. BancorpMilitary?: No Are There Guns or Other Weapons in Your Home?: No  Education: Engineer, civil (consulting)ducation School Currently Attending: [employer] Last Grade Completed: 12 Name of [employer]: [employer] Did Garment/textile technologistYou Graduate From [employer] School?: Yes Did Theme park managerYou Attend College?: Yes What Type of College Degree Do you Have?: currently a Printmakerfreshman at [employer] [place] Attend Graduate School?: No What Was Your Major?: Psychiatric nurseBiological Sciences, plans to go to Medical School Did You Have Any Special Interests In School?: no Did You Have An Individualized Education Program (IIEP): No Did You Have Any Difficulty At School?: No  Religion: Religion/Spirituality Are You A Religious Person?: No How Might This Affect Treatment?: it won't  Leisure/Recreation: Leisure / Recreation Leisure and Hobbies: music, cycling  Exercise/Diet: Exercise/Diet Do You Exercise?: Yes What Type of Exercise Do You Do?: Bike How Many Times a Week Do You Exercise?: 1-3 times a week Have You Gained or Lost A Significant Amount of Weight in the Past Six Months?: Yes-Lost Number of Pounds Lost?: 10(change in appetite over the past 2 months) Do You Follow a Special Diet?: No Do You Have Any Trouble Sleeping?: Yes Explanation of Sleeping Difficulties: trouble sleeping because of pain  CCA Part Two C  Alcohol/Drug Use: Alcohol / Drug Use Pain Medications: see MAR Prescriptions: see MAR Over the Counter: see MAR History of alcohol / drug use?: No history of alcohol / drug abuse  CCA Part Three  ASAM's:  Six Dimensions of Multidimensional Assessment  Dimension 1:  Acute Intoxication and/or Withdrawal Potential:     Dimension 2:  Biomedical Conditions and Complications:      Dimension 3:  Emotional, Behavioral, or Cognitive Conditions and Complications:     Dimension 4:  Readiness to Change:     Dimension 5:  Relapse, Continued use, or Continued Problem Potential:     Dimension 6:  Recovery/Living Environment:      Substance use Disorder (SUD)    Social Function:  Social Functioning Social Maturity: Responsible Social Judgement: Normal  Stress:  Stress Stressors: Illness, Work(school, unable to do schoolwork because of pain issues) Coping Ability: Overwhelmed Patient Takes Medications The Way The Doctor Instructed?: Yes Priority Risk: Low Acuity  Risk Assessment- Self-Harm Potential: Risk Assessment For Self-Harm Potential Thoughts of Self-Harm: No current thoughts Method: No plan Availability of Means: No access/NA  Risk Assessment -Dangerous to Others Potential: Risk Assessment For Dangerous to Others Potential Method: No Plan Availability of Means: No access or NA Intent: Vague intent or NA Notification Required: No need or identified person  DSM5 Diagnoses: Patient Active Problem List   Diagnosis Date Noted  . Generalized muscle ache 11/26/2018  . Chronic neck pain 11/26/2018  . Generalized joint pain 10/31/2018  . Acute otitis media 10/31/2018  . Numbness and tingling of left upper and lower extremity 10/26/2018    Patient Centered Plan: Patient is on the following Treatment Plan(s):  Depression  Recommendations for Services/Supports/Treatments: Recommendations for Services/Supports/Treatments Recommendations For Services/Supports/Treatments: Individual Therapy  Treatment Plan Summary: OP Treatment Plan Summary: "help me get through the pain and to cope with stress"  Referrals to Alternative Service(s): Referred to Alternative Service(s):   Place:   Date:   Time:    Referred to Alternative Service(s):   Place:   Date:   Time:    Referred to Alternative Service(s):   Place:   Date:   Time:    Referred to Alternative  Service(s):   Place:   Date:   Time:     Veneda Melter, LCSW

## 2019-08-05 ENCOUNTER — Telehealth: Payer: Self-pay | Admitting: Family Medicine

## 2019-08-05 DIAGNOSIS — R519 Headache, unspecified: Secondary | ICD-10-CM | POA: Diagnosis not present

## 2019-08-05 DIAGNOSIS — R200 Anesthesia of skin: Secondary | ICD-10-CM | POA: Diagnosis not present

## 2019-08-05 DIAGNOSIS — R202 Paresthesia of skin: Secondary | ICD-10-CM | POA: Diagnosis not present

## 2019-08-05 NOTE — Telephone Encounter (Signed)
error 

## 2019-08-14 DIAGNOSIS — R202 Paresthesia of skin: Secondary | ICD-10-CM | POA: Diagnosis not present

## 2019-08-19 ENCOUNTER — Ambulatory Visit (INDEPENDENT_AMBULATORY_CARE_PROVIDER_SITE_OTHER): Payer: Medicaid Other | Admitting: Licensed Clinical Social Worker

## 2019-08-19 ENCOUNTER — Other Ambulatory Visit: Payer: Self-pay

## 2019-08-19 ENCOUNTER — Encounter (HOSPITAL_COMMUNITY): Payer: Self-pay | Admitting: Licensed Clinical Social Worker

## 2019-08-19 DIAGNOSIS — F329 Major depressive disorder, single episode, unspecified: Secondary | ICD-10-CM

## 2019-08-19 DIAGNOSIS — F0634 Mood disorder due to known physiological condition with mixed features: Secondary | ICD-10-CM | POA: Diagnosis not present

## 2019-08-19 NOTE — Progress Notes (Signed)
Virtual Visit via Video Note  I connected with Phillip Hopkins on 08/19/19 at 10:00 AM EST by a video enabled telemedicine application and verified that I am speaking with the correct person using two identifiers.     I discussed the limitations of evaluation and management by telemedicine and the availability of in person appointments. The patient expressed understanding and agreed to proceed.  Type of Therapy: Individual Therapy  Treatment Goals addressed: "help me get through the pain and to cope with stress"  Interventions: Motivational Interviewing, CBT, and Other: Grounding and Mindfulness techniques  Summary: Phillip Hopkins is a 18 y.o. 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 Phillip Hopkins discussed his psychiatric symptoms and current life events. Phillip Hopkins shared ongoing frustration that he has not yet been diagnosed with anything explaining his physical pain, nausea, and lack of energy. He reports he spends a lot of time sleeping, due to pain and nausea medications. He also reports he has dropped several classes due to not being able to cope with that amount of work at this time. Clinician explored daily routine, sleep hygiene, and triggers to medical issues. Clinician encouraged Phillip Hopkins to continue documenting his daily sxs in order to track and report to doctors. Clinician explored use of coping skills and encouraged Phillip Hopkins to maintain basic levels of hygiene, movement, and nutrition.   Plan: Return again in 2-3 weeks.  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 The patient was provided an opportunity to ask questions and all were answered. The patient agreed with the plan and demonstrated an understanding of the  instructions.   The patient was advised to call back or seek an in-person evaluation if the symptoms worsen or if the condition fails to improve as anticipated.  I provided 45 minutes of non-face-to-face time during this encounter.   Mindi Curling, LCSW

## 2019-08-26 DIAGNOSIS — R200 Anesthesia of skin: Secondary | ICD-10-CM | POA: Diagnosis not present

## 2019-08-26 DIAGNOSIS — R202 Paresthesia of skin: Secondary | ICD-10-CM | POA: Diagnosis not present

## 2019-08-26 DIAGNOSIS — R519 Headache, unspecified: Secondary | ICD-10-CM | POA: Diagnosis not present

## 2019-09-12 ENCOUNTER — Ambulatory Visit (INDEPENDENT_AMBULATORY_CARE_PROVIDER_SITE_OTHER): Payer: Medicaid Other | Admitting: Licensed Clinical Social Worker

## 2019-09-12 ENCOUNTER — Encounter (HOSPITAL_COMMUNITY): Payer: Self-pay | Admitting: Licensed Clinical Social Worker

## 2019-09-12 ENCOUNTER — Other Ambulatory Visit: Payer: Self-pay

## 2019-09-12 DIAGNOSIS — G8929 Other chronic pain: Secondary | ICD-10-CM | POA: Diagnosis not present

## 2019-09-12 DIAGNOSIS — F0634 Mood disorder due to known physiological condition with mixed features: Secondary | ICD-10-CM

## 2019-09-12 DIAGNOSIS — F339 Major depressive disorder, recurrent, unspecified: Secondary | ICD-10-CM | POA: Diagnosis not present

## 2019-09-12 NOTE — Progress Notes (Signed)
Virtual Visit via Video Note  I connected with Phillip Hopkins on 09/12/19 at 11:00 AM EST by a video enabled telemedicine application and verified that I am speaking with the correct person using two identifiers.    I discussed the limitations of evaluation and management by telemedicine and the availability of in person appointments. The patient expressed understanding and agreed to proceed.  Type of Therapy: Individual Therapy  Treatment Goals addressed: "help me get through the pain and to cope with stress"  Interventions: Motivational Interviewing, CBT, and Other: Grounding and Mindfulness techniques  Summary: Phillip Hopkins is a 18 y.o. 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 Phillip Hopkins discussed his psychiatric symptoms and current life events. Phillip Hopkins shared ongoing pain and depressive sxs due to undiagnosed pain disorder. Clinician explored mood sxs as well as pain scale rating. Phillip Hopkins identified a current and average score of 6 on a pain scale. Clinician discussed movement and exercise, as well as time when he is sitting up and laying down. Clinician noted that sometimes laying down or sitting down too long can increase pain sxs. Clinician provided psychoeducation about radical acceptance of pain sxs as a way to accept and move forward. Clinician also provided psychoeducation about somatization and how emotional stress can manifest in physical pain. Clinician explored any residual or long term emotional stress. Phillip Hopkins identified some concerns about his sexuality and how his family would react if he was honest. Clinician utilized MI OARS to reflect and summarize thoughts and feelings. Clinician also normalized these issues and encouraged Phillip Hopkins to do what he felt was right at the time that works for him. Clinician explored other natural  supports and identified that he is talking to his friends about these issues.    Plan: Return again in 2-3 weeks.  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 The patient was provided an opportunity to ask questions and all were answered. The patient agreed with the plan and demonstrated an understanding of the instructions.   The patient was advised to call back or seek an in-person evaluation if the symptoms worsen or if the condition fails to improve as anticipated.  I provided 45 minutes of non-face-to-face time during this encounter.   Mindi Curling, LCSW

## 2019-10-15 ENCOUNTER — Other Ambulatory Visit: Payer: Self-pay

## 2019-10-15 ENCOUNTER — Ambulatory Visit (HOSPITAL_COMMUNITY): Payer: Medicaid Other | Admitting: Licensed Clinical Social Worker

## 2019-10-23 DIAGNOSIS — F0280 Dementia in other diseases classified elsewhere without behavioral disturbance: Secondary | ICD-10-CM | POA: Diagnosis not present

## 2019-10-23 DIAGNOSIS — G3183 Dementia with Lewy bodies: Secondary | ICD-10-CM | POA: Diagnosis not present

## 2019-10-23 DIAGNOSIS — R202 Paresthesia of skin: Secondary | ICD-10-CM | POA: Diagnosis not present

## 2019-11-27 ENCOUNTER — Ambulatory Visit: Payer: Medicaid Other | Admitting: Family Medicine

## 2019-11-29 ENCOUNTER — Telehealth: Payer: Self-pay | Admitting: Family Medicine

## 2019-11-29 NOTE — Telephone Encounter (Signed)
Pt was called and reminded of there appointment 

## 2019-12-02 ENCOUNTER — Ambulatory Visit (INDEPENDENT_AMBULATORY_CARE_PROVIDER_SITE_OTHER): Payer: Medicaid Other | Admitting: Family Medicine

## 2019-12-02 ENCOUNTER — Other Ambulatory Visit: Payer: Self-pay

## 2019-12-02 ENCOUNTER — Encounter: Payer: Self-pay | Admitting: Family Medicine

## 2019-12-02 VITALS — BP 117/82 | HR 101 | Temp 98.6°F | Resp 16 | Ht 65.0 in | Wt 139.0 lb

## 2019-12-02 DIAGNOSIS — Z09 Encounter for follow-up examination after completed treatment for conditions other than malignant neoplasm: Secondary | ICD-10-CM | POA: Diagnosis not present

## 2019-12-02 DIAGNOSIS — R200 Anesthesia of skin: Secondary | ICD-10-CM | POA: Diagnosis not present

## 2019-12-02 DIAGNOSIS — F419 Anxiety disorder, unspecified: Secondary | ICD-10-CM | POA: Diagnosis not present

## 2019-12-02 DIAGNOSIS — F329 Major depressive disorder, single episode, unspecified: Secondary | ICD-10-CM

## 2019-12-02 DIAGNOSIS — R202 Paresthesia of skin: Secondary | ICD-10-CM

## 2019-12-02 DIAGNOSIS — F32A Depression, unspecified: Secondary | ICD-10-CM | POA: Insufficient documentation

## 2019-12-02 NOTE — Progress Notes (Signed)
[facility]   Established Patient Office Visit  Subjective:  Patient ID: Phillip Hopkins, male    DOB: 2001-01-09  Age: 19 y.o. MRN: [id number]  CC:  Chief Complaint  Patient presents with  . Follow-up    HPI Dane Kopke is a 19 year old male who presents for Follow Up today.   Past Medical History:  Diagnosis Date  . Acute bronchitis   . Anxiety 2019  . Cough   . Generalized joint pain   . Migraines   . Upper respiratory tract infection    Current Status: Since his last office visit, he is doing well with no complaints. His anxiety is mild today. He states that he feels as though the is doing better and does not need He denies suicidal ideations, homicidal ideations, or auditory hallucinations. He denies fevers, chills, fatigue, recent infections, weight loss, and night sweats. He has not had any headaches, visual changes, dizziness, and falls. No chest pain, heart palpitations, cough and shortness of breath reported. No reports of GI problems such as nausea, vomiting, diarrhea, and constipation. He has no reports of blood in stools, dysuria and hematuria. He denies pain today.    History reviewed. No pertinent surgical history.  History reviewed. No pertinent family history.  Social History   Socioeconomic History  . Marital status: Single    Spouse name: Not on file  . Number of children: Not on file  . Years of education: Not on file  . Highest education level: Not on file  Occupational History  . Not on file  Tobacco Use  . Smoking status: Never Smoker  . Smokeless tobacco: Never Used  Substance and Sexual Activity  . Alcohol use: Never  . Drug use: Never  . Sexual activity: Not on file  Other Topics Concern  . Not on file  Social History Narrative  . Not on file   Social Determinants of Health   Financial Resource Strain:   . Difficulty of Paying Living Expenses: Not on file  Food Insecurity:   . Worried About  Programme researcher, broadcasting/film/video in the Last Year: Not on file  . Ran Out of Food in the Last Year: Not on file  Transportation Needs:   . Lack of Transportation (Medical): Not on file  . Lack of Transportation (Non-Medical): Not on file  Physical Activity:   . Days of Exercise per Week: Not on file  . Minutes of Exercise per Session: Not on file  Stress:   . Feeling of Stress : Not on file  Social Connections:   . Frequency of Communication with Friends and Family: Not on file  . Frequency of Social Gatherings with Friends and Family: Not on file  . Attends Religious Services: Not on file  . Active Member of Clubs or Organizations: Not on file  . Attends Banker Meetings: Not on file  . Marital Status: Not on file  Intimate Partner Violence:   . Fear of Current or Ex-Partner: Not on file  . Emotionally Abused: Not on file  . Physically Abused: Not on file  . Sexually Abused: Not on file    Outpatient Medications Prior to Visit  Medication Sig Dispense Refill  . nortriptyline (PAMELOR) 10 MG capsule Take 20 mg by mouth at bedtime.     Marland Kitchen venlafaxine (EFFEXOR) 37.5 MG tablet Take by mouth.     No facility-administered medications prior to visit.    No  Known Allergies  ROS Review of Systems  Constitutional: Negative.   HENT: Negative.   Eyes: Negative.   Respiratory: Negative.   Cardiovascular: Negative.   Gastrointestinal: Negative.   Endocrine: Negative.   Genitourinary: Negative.   Musculoskeletal: Negative.   Skin: Negative.   Allergic/Immunologic: Negative.   Neurological: Positive for numbness (upper extremities).  Hematological: Negative.   Psychiatric/Behavioral: Negative.       Objective:    Physical Exam  Constitutional: He is oriented to person, place, and time. He appears well-developed and well-nourished.  HENT:  Head: Normocephalic and atraumatic.  Eyes: Conjunctivae are normal.  Cardiovascular: Normal rate, regular rhythm, normal heart sounds  and intact distal pulses.  Pulmonary/Chest: Effort normal and breath sounds normal.  Abdominal: Soft. Bowel sounds are normal.  Musculoskeletal:        General: Normal range of motion.     Cervical back: Normal range of motion and neck supple.  Neurological: He is alert and oriented to person, place, and time. He has normal reflexes.  Skin: Skin is warm.  Psychiatric: He has a normal mood and affect. His behavior is normal. Judgment and thought content normal.  Nursing note and vitals reviewed.   BP 117/82 (BP Location: Right Arm, Patient Position: Sitting, Cuff Size: Normal)   Pulse (!) 101   Temp 98.6 F (37 C) (Oral)   Resp 16   Ht 5\' 5"  (1.651 m)   Wt 139 lb (63 kg)   SpO2 99%   BMI 23.13 kg/m  Wt Readings from Last 3 Encounters:  12/02/19 139 lb (63 kg) (24 %, Z= -0.72)*  07/08/19 140 lb (63.5 kg) (27 %, Z= -0.62)*  05/27/19 142 lb 12.8 oz (64.8 kg) (32 %, Z= -0.47)*   * Growth percentiles are based on CDC (Boys, 2-20 Years) data.     Health Maintenance Due  Topic Date Due  . HIV Screening  02/12/2015    There are no preventive care reminders to display for this patient.  Lab Results  Component Value Date   TSH 4.400 11/19/2018   Lab Results  Component Value Date   WBC 9.3 07/08/2019   HGB 17.0 07/08/2019   HCT 49.0 07/08/2019   MCV 88.3 07/08/2019   PLT 255 07/08/2019   Lab Results  Component Value Date   NA 139 07/08/2019   K 4.3 07/08/2019   CO2 25 07/08/2019   GLUCOSE 91 07/08/2019   BUN 15 07/08/2019   CREATININE 1.12 07/08/2019   BILITOT 0.7 10/26/2018   ALKPHOS 80 10/26/2018   AST 32 10/26/2018   ALT 59 (H) 10/26/2018   PROT 7.2 10/26/2018   ALBUMIN 5.1 10/26/2018   CALCIUM 9.7 07/08/2019   ANIONGAP 12 07/08/2019   No results found for: CHOL No results found for: HDL No results found for: LDLCALC No results found for: TRIG No results found for: CHOLHDL Lab Results  Component Value Date   HGBA1C 5.3 10/26/2018      Assessment &  Plan:   1. Numbness and tingling of left upper and lower extremity  2. Depressive disorder He will continue to follow up with [facility] as needed.   3. Anxiety  4. Follow up He will follow up as needed.   No orders of the defined types were placed in this encounter.   No orders of the defined types were placed in this encounter.   Referral Orders  No referral(s) requested today   [facility],  MSN, FNP-BC [place]  [facility] [address] [place], [place] [address] [phone]   Problem List Items Addressed This Visit      Other   Numbness and tingling of left upper and lower extremity - Primary    Other Visit Diagnoses    Depressive disorder       Anxiety       Follow up          No orders of the defined types were placed in this encounter.   Follow-up: Return in about 7 months (around 07/03/2020).    Azzie Glatter, FNP

## 2020-01-14 IMAGING — MR MRI HEAD WITHOUT CONTRAST
8 series · 30 of 48 positions shown · non-contrast
Comparison: None.

CLINICAL DATA: New daily headaches

EXAM:
MRI HEAD WITHOUT CONTRAST
TECHNIQUE: Multiplanar, multiecho pulse sequences of the brain and surrounding
structures were obtained without intravenous contrast.

[Series 3: DWI · axial · 3.0mm · 1.09mm/px · z∈[-23,+136]mm · 9 of 108 slices shown (1 of 2)]
[im 1/108]
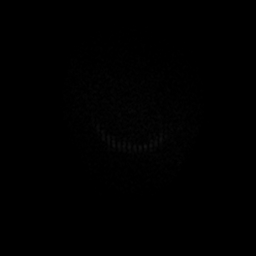
[im 18/108]
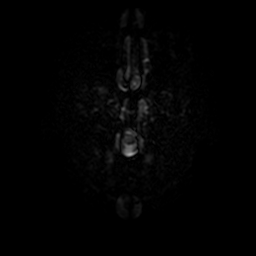
[im 36/108]
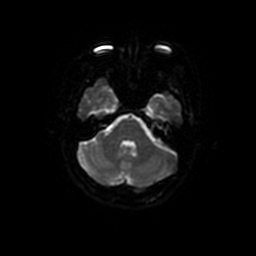
[im 45/108]
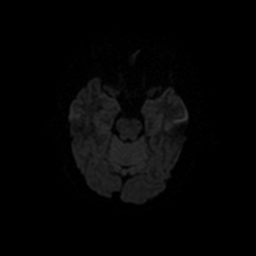
[im 54/108]
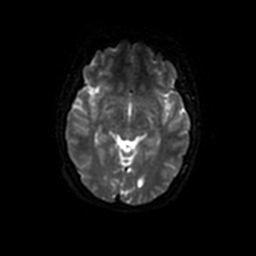
[im 63/108]
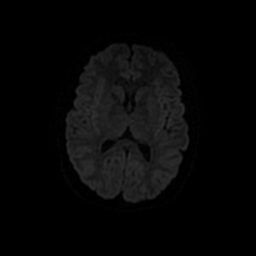
[im 72/108]
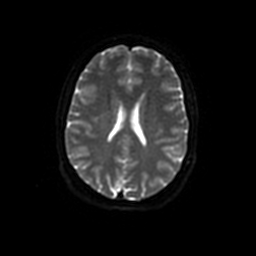
[im 90/108]
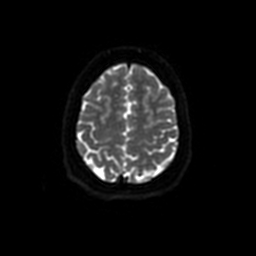
[im 108/108]
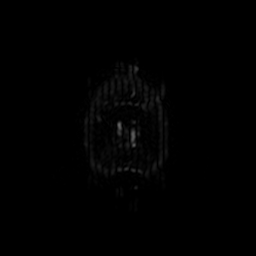

[Series 4: T1 · sagittal · 5.0mm · 0.47mm/px · 3 of 20 slices shown]
[im 1/20]
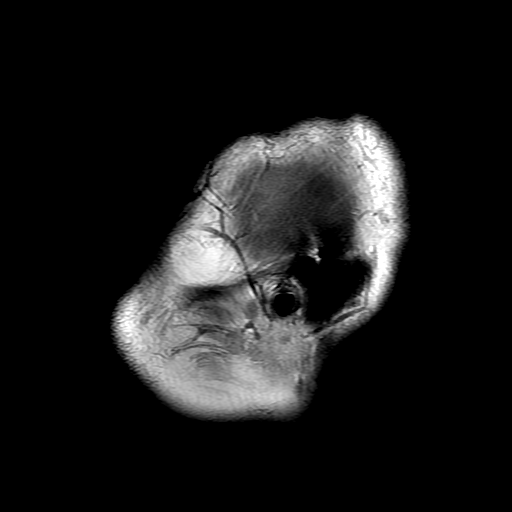
[im 10/20]
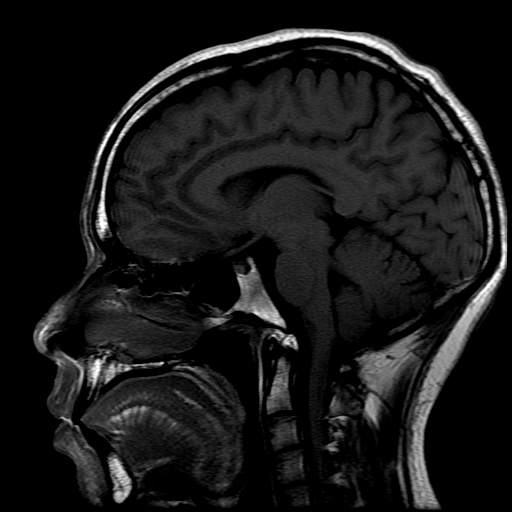
[im 20/20]
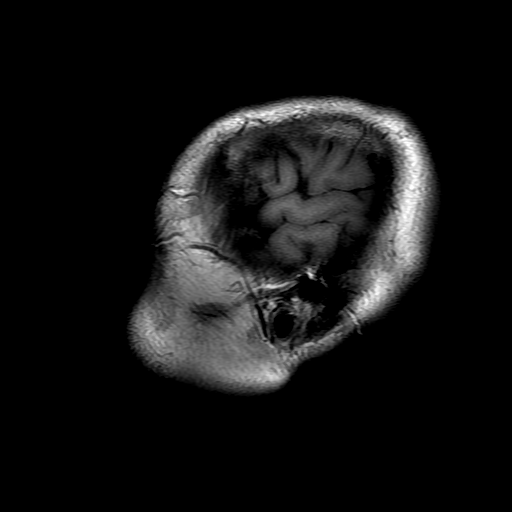

[Series 5: T2 · axial · 5.0mm · 0.43mm/px · z∈[-9,+138]mm · 2 of 22 slices shown (1 of 2)]
[im 1/22]
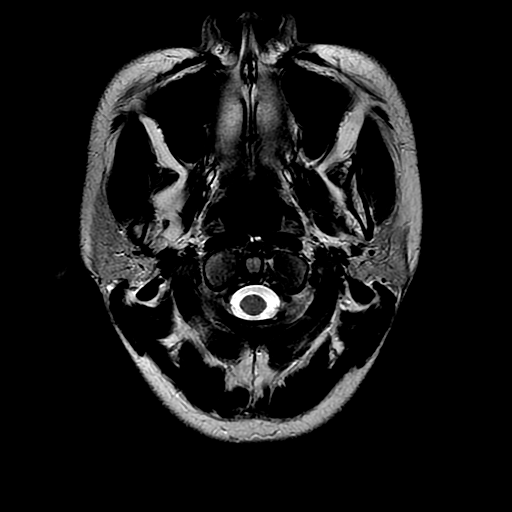
[im 22/22]
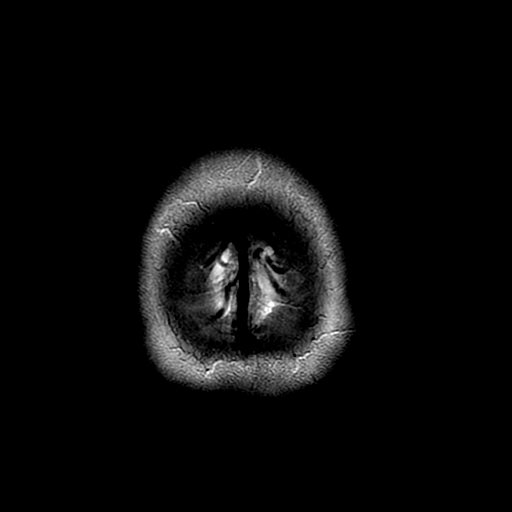

[Series 6: FLAIR · axial · 3.0mm · 0.43mm/px · z∈[-8,+141]mm · 3 of 26 slices shown]
[im 1/26]
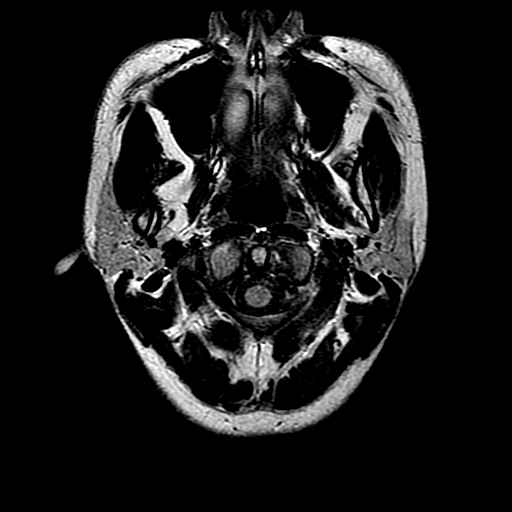
[im 13/26]
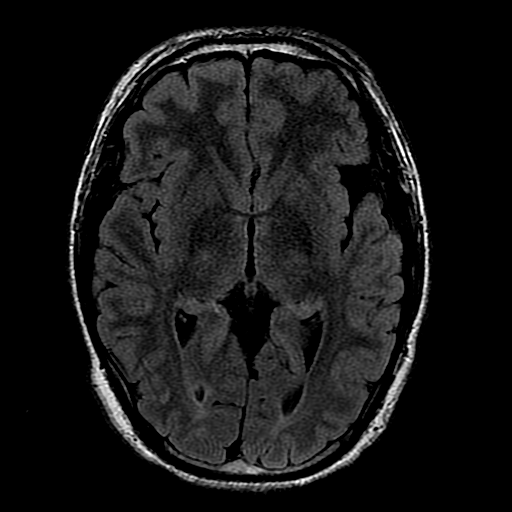
[im 26/26]
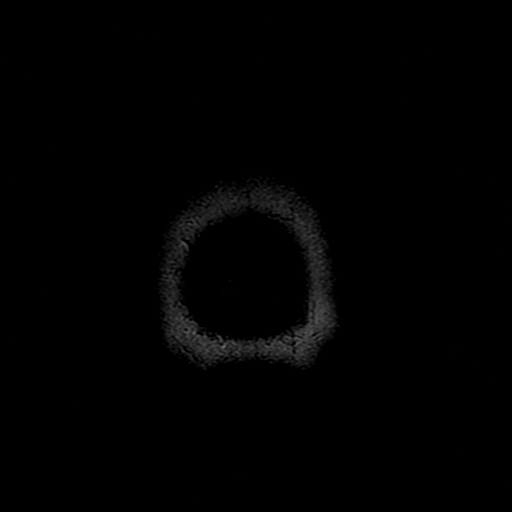

[Series 7: ax mpgr · axial · 3.0mm · 0.43mm/px · z∈[-6,+139]mm · 3 of 30 slices shown]
[im 1/30]
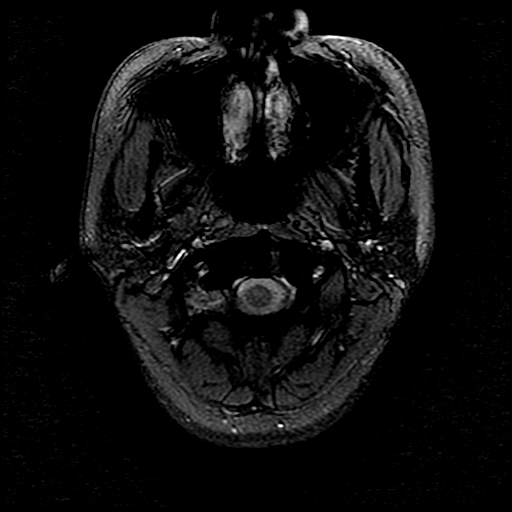
[im 15/30]
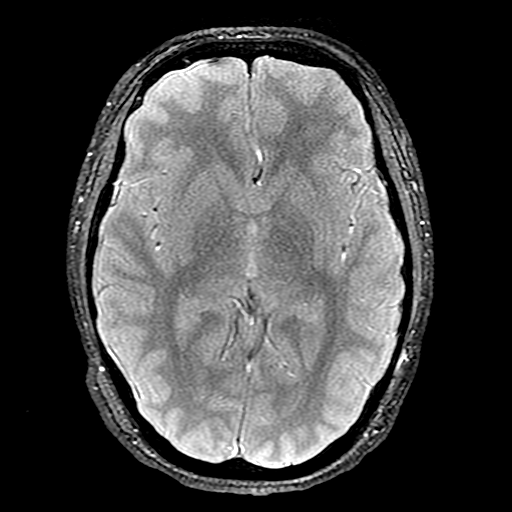
[im 30/30]
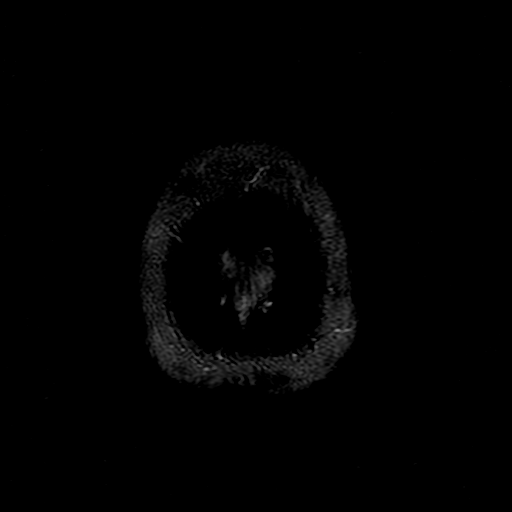

[Series 8: ax fspgr irp · axial · 1.0mm · 0.47mm/px · 1 of 136 slices shown]
[im 1/136]
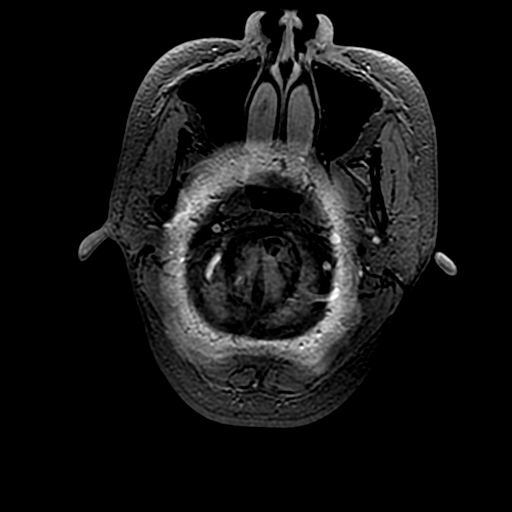

[Series 9: T2 · coronal · 5.0mm · 0.45mm/px · 3 of 29 slices shown (2 of 2)]
[im 1/29]
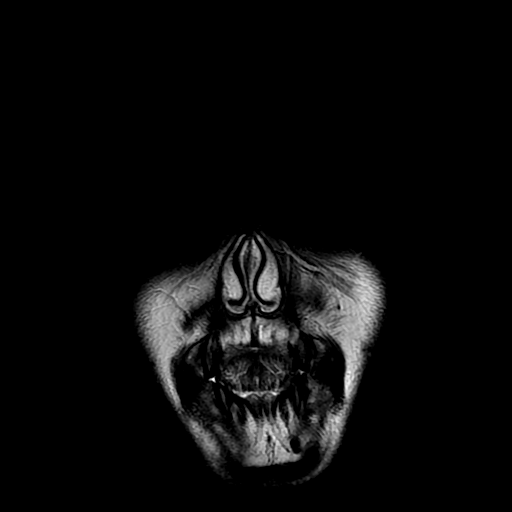
[im 15/29]
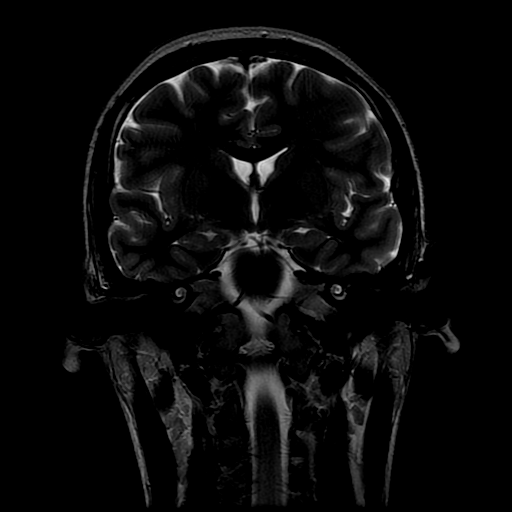
[im 29/29]
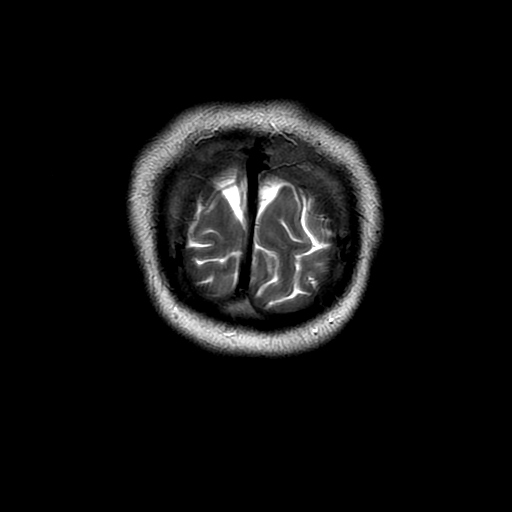

[Series 300: DWI · axial · 3.0mm · 1.09mm/px · z∈[-23,+136]mm · 6 of 54 slices shown (2 of 2)]
[im 1/54]
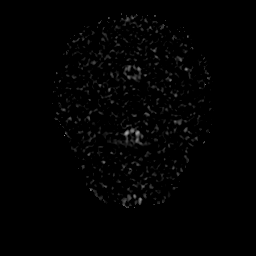
[im 11/54]
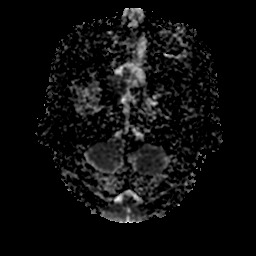
[im 22/54]
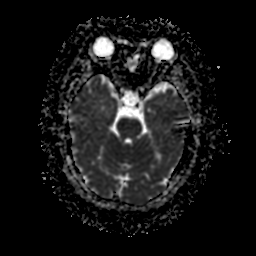
[im 32/54]
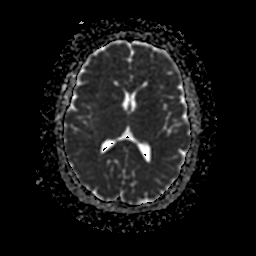
[im 43/54]
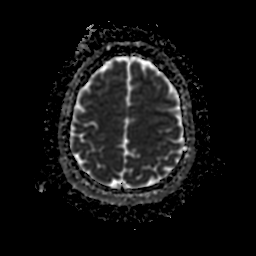
[im 54/54]
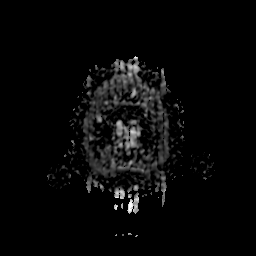

[30 of 48 positions shown; findings below may reference images not displayed]

FINDINGS: Brain: No acute infarction, hemorrhage, hydrocephalus, extra-axial
collection or mass lesion. Normal white matter

Vascular: Normal arterial flow voids.

Skull and upper cervical spine: Negative

Sinuses/Orbits: Negative

Other: None
IMPRESSION: Normal MRI brain

## 2020-07-03 ENCOUNTER — Other Ambulatory Visit: Payer: Self-pay

## 2020-07-03 ENCOUNTER — Encounter: Payer: Self-pay | Admitting: Family Medicine

## 2020-07-03 ENCOUNTER — Ambulatory Visit (INDEPENDENT_AMBULATORY_CARE_PROVIDER_SITE_OTHER): Payer: Medicaid Other | Admitting: Family Medicine

## 2020-07-03 VITALS — BP 116/74 | HR 61 | Temp 98.7°F | Ht 65.0 in | Wt 139.2 lb

## 2020-07-03 DIAGNOSIS — Z5289 Donor of other specified organs or tissues: Secondary | ICD-10-CM | POA: Diagnosis not present

## 2020-07-03 DIAGNOSIS — M2550 Pain in unspecified joint: Secondary | ICD-10-CM

## 2020-07-03 DIAGNOSIS — F329 Major depressive disorder, single episode, unspecified: Secondary | ICD-10-CM | POA: Diagnosis not present

## 2020-07-03 DIAGNOSIS — Z0000 Encounter for general adult medical examination without abnormal findings: Secondary | ICD-10-CM

## 2020-07-03 DIAGNOSIS — F32A Depression, unspecified: Secondary | ICD-10-CM

## 2020-07-03 DIAGNOSIS — F419 Anxiety disorder, unspecified: Secondary | ICD-10-CM | POA: Diagnosis not present

## 2020-07-03 DIAGNOSIS — Z09 Encounter for follow-up examination after completed treatment for conditions other than malignant neoplasm: Secondary | ICD-10-CM | POA: Diagnosis not present

## 2020-07-03 LAB — POCT URINALYSIS DIPSTICK
Bilirubin, UA: NEGATIVE
Blood, UA: NEGATIVE
Glucose, UA: NEGATIVE
Ketones, UA: NEGATIVE
Leukocytes, UA: NEGATIVE
Nitrite, UA: NEGATIVE
Protein, UA: NEGATIVE
Spec Grav, UA: 1.02 (ref 1.010–1.025)
Urobilinogen, UA: 0.2 E.U./dL
pH, UA: 6.5 (ref 5.0–8.0)

## 2020-07-03 NOTE — Progress Notes (Signed)
[facility]   Annual Physical  Subjective:  Patient ID: Phillip Hopkins, male    DOB: 2001/02/05  Age: 19 y.o. MRN: [id number]  CC:  Chief Complaint  Patient presents with  . Follow-up    Pt states he would like a referral to [facility]. Pt states he would like to freeze. his sperm.    HPI Phillip Hopkins is a 19 year old male who presents for Follow Up today.   Patient Active Problem List   Diagnosis Date Noted  . Depressive disorder 12/02/2019  . Anxiety 12/02/2019  . Generalized muscle ache 11/26/2018  . Chronic neck pain 11/26/2018  . Generalized joint pain 10/31/2018  . Acute otitis media 10/31/2018  . Numbness and tingling of left upper and lower extremity 10/26/2018   Current Status: Since his last office visit, he is doing well with no complaints. He has not continued to follow up with Milus Glazier, LCSW for Depressive Disorder,as he states that he no longer needs Psychiatry. His last follow up was 09/11/2020. Since then, he has not had any symptoms and feels that he does not need councilor at this time. His depression and anxiety is stable today. He denies suicidal ideations, homicidal ideations, or auditory hallucinations. 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 He is requesting to have his sperm 'frozen." He denies fevers, chills, fatigue, recent infections, weight loss, and night sweats. He has not had any headaches, visual changes, dizziness, and falls. No chest pain, heart palpitations, cough and shortness of breath reported. Denies GI problems such as nausea, vomiting, diarrhea, and constipation. He has no reports of blood in stools, dysuria and hematuria. He is taking all medications as prescribed.   Past Medical History:  Diagnosis Date  . Acute bronchitis   . Anxiety 2019  . Cough   . Generalized joint pain   . Migraines   . Upper respiratory tract infection     No past surgical  history on file.  No family history on file.  Social History   Socioeconomic History  . Marital status: Single    Spouse name: Not on file  . Number of children: Not on file  . Years of education: Not on file  . Highest education level: Not on file  Occupational History  . Not on file  Tobacco Use  . Smoking status: Never Smoker  . Smokeless tobacco: Never Used  Substance and Sexual Activity  . Alcohol use: Never  . Drug use: Never  . Sexual activity: Not on file  Other Topics Concern  . Not on file  Social History Narrative  . Not on file   Social Determinants of Health   Financial Resource Strain:   . Difficulty of Paying Living Expenses: Not on file  Food Insecurity:   . Worried About Programme researcher, broadcasting/film/video in the Last Year: Not on file  . Ran Out of Food in the Last Year: Not on file  Transportation Needs:   . Lack of Transportation (Medical): Not on file  . Lack of Transportation (Non-Medical): Not on file  Physical Activity:   . Days of Exercise per Week: Not on file  . Minutes of Exercise per Session: Not on file  Stress:   . Feeling of Stress : Not on file  Social Connections:   . Frequency of Communication with Friends and Family: Not on file  . Frequency of Social Gatherings with  Friends and Family: Not on file  . Attends Religious Services: Not on file  . Active Member of Clubs or Organizations: Not on file  . Attends Banker Meetings: Not on file  . Marital Status: Not on file  Intimate Partner Violence:   . Fear of Current or Ex-Partner: Not on file  . Emotionally Abused: Not on file  . Physically Abused: Not on file  . Sexually Abused: Not on file    Outpatient Medications Prior to Visit  Medication Sig Dispense Refill  . nortriptyline (PAMELOR) 10 MG capsule Take 20 mg by mouth at bedtime.  (Patient not taking: Reported on 07/03/2020)    . venlafaxine (EFFEXOR) 37.5 MG tablet Take by mouth. (Patient not taking: Reported on 07/03/2020)      No facility-administered medications prior to visit.    No Known Allergies  ROS Review of Systems  Constitutional: Negative.   HENT: Negative.   Eyes: Negative.   Respiratory: Negative.   Cardiovascular: Negative.   Gastrointestinal: Negative.   Endocrine: Negative.   Genitourinary: Negative.   Musculoskeletal: Negative.   Skin: Negative.   Allergic/Immunologic: Negative.   Neurological: Negative.   Hematological: Negative.   Psychiatric/Behavioral: Negative.       Objective:    Physical Exam Vitals and nursing note reviewed.  Constitutional:      Appearance: Normal appearance.  HENT:     Head: Normocephalic and atraumatic.     Nose: Nose normal.     Mouth/Throat:     Mouth: Mucous membranes are moist.     Pharynx: Oropharynx is clear.  Cardiovascular:     Rate and Rhythm: Normal rate and regular rhythm.     Pulses: Normal pulses.     Heart sounds: Normal heart sounds.  Pulmonary:     Effort: Pulmonary effort is normal.     Breath sounds: Normal breath sounds.  Abdominal:     General: Abdomen is flat. Bowel sounds are normal.     Palpations: Abdomen is soft.  Musculoskeletal:        General: Normal range of motion.     Cervical back: Normal range of motion and neck supple.  Skin:    General: Skin is warm and dry.  Neurological:     Mental Status: He is alert and oriented to person, place, and time.  Psychiatric:        Mood and Affect: Mood normal.        Behavior: Behavior normal.        Thought Content: Thought content normal.        Judgment: Judgment normal.     BP 116/74 (BP Location: Left Arm, Patient Position: Sitting, Cuff Size: Normal)   Pulse 61   Temp 98.7 F (37.1 C)   Ht 5\' 5"  (1.651 m)   Wt 139 lb 3.2 oz (63.1 kg)   SpO2 98%   BMI 23.16 kg/m  Wt Readings from Last 3 Encounters:  07/03/20 139 lb 3.2 oz (63.1 kg)  12/02/19 139 lb (63 kg) (24 %, Z= -0.72)*  07/08/19 140 lb (63.5 kg) (27 %, Z= -0.62)*   * Growth percentiles are  based on CDC (Boys, 2-20 Years) data.     Health Maintenance Due  Topic Date Due  . Hepatitis C Screening  Never done  . HIV Screening  Never done    There are no preventive care reminders to display for this patient.  Lab Results  Component Value Date   TSH 2.900 07/03/2020  Lab Results  Component Value Date   WBC 5.0 07/03/2020   HGB 14.9 07/03/2020   HCT 44.0 07/03/2020   MCV 89 07/03/2020   PLT 236 07/03/2020   Lab Results  Component Value Date   NA 138 07/03/2020   K 3.9 07/03/2020   CO2 22 07/03/2020   GLUCOSE 98 07/03/2020   BUN 14 07/03/2020   CREATININE 0.99 07/03/2020   BILITOT 0.6 07/03/2020   ALKPHOS 75 07/03/2020   AST 22 07/03/2020   ALT 21 07/03/2020   PROT 7.4 07/03/2020   ALBUMIN 5.1 07/03/2020   CALCIUM 9.5 07/03/2020   ANIONGAP 12 07/08/2019   No results found for: CHOL No results found for: HDL No results found for: LDLCALC No results found for: TRIG No results found for: CHOLHDL Lab Results  Component Value Date   HGBA1C 5.3 10/26/2018   Assessment & Plan:   1. Annual physical exam Physical assessment within normal for age. Recommend monthly testicular exams.  Recommend daily multivitamin.  Recommend strength training in 150 minutes of cardiovascular exercise per week  2. Generalized joint pain  3. Depressive disorder Stable today.   4. Anxiety  5. Sperm donor Referral sent to [facility] for consultation.  - Ambulatory referral to Infertility  6. Healthcare maintenance - CBC with Differential - Comprehensive metabolic panel - TSH - Vitamin B12 - Vitamin D, 25-hydroxy  7. Follow up He will follow up as needed.  He will possible transfer to PCP near [place] where he currently lives and attends [employer].    No orders of the defined types were placed in this encounter.   Orders Placed This Encounter  Procedures  . CBC with Differential  . Comprehensive metabolic panel  . TSH  . Vitamin B12    . Vitamin D, 25-hydroxy  . Ambulatory referral to Infertility  . Urinalysis Dipstick     Referral Orders     Ambulatory referral to Infertility  Raliegh Ip,  MSN, FNP-BC [facility] [address] [place], [place] [address] [phone] fax  Problem List Items Addressed This Visit      Other   Anxiety   Depressive disorder   Generalized joint pain    Other Visit Diagnoses    Annual physical exam    -  Primary   Sperm donor       Relevant Orders   Ambulatory referral to Infertility   Healthcare maintenance       Relevant Orders   CBC with Differential (Completed)   Comprehensive metabolic panel (Completed)   TSH (Completed)   Vitamin B12 (Completed)   Vitamin D, 25-hydroxy (Completed)   Urinalysis Dipstick (Completed)   Follow up          No orders of the defined types were placed in this encounter.   Follow-up: Return in about 1 year (around 07/03/2021).    Kallie Locks, FNP

## 2020-07-04 LAB — CBC WITH DIFFERENTIAL/PLATELET
Basophils Absolute: 0 10*3/uL (ref 0.0–0.2)
Basos: 1 %
EOS (ABSOLUTE): 0.1 10*3/uL (ref 0.0–0.4)
Eos: 1 %
Hematocrit: 44 % (ref 37.5–51.0)
Hemoglobin: 14.9 g/dL (ref 13.0–17.7)
Immature Grans (Abs): 0 10*3/uL (ref 0.0–0.1)
Immature Granulocytes: 0 %
Lymphocytes Absolute: 1.5 10*3/uL (ref 0.7–3.1)
Lymphs: 31 %
MCH: 30.3 pg (ref 26.6–33.0)
MCHC: 33.9 g/dL (ref 31.5–35.7)
MCV: 89 fL (ref 79–97)
Monocytes Absolute: 0.4 10*3/uL (ref 0.1–0.9)
Monocytes: 8 %
Neutrophils Absolute: 2.9 10*3/uL (ref 1.4–7.0)
Neutrophils: 59 %
Platelets: 236 10*3/uL (ref 150–450)
RBC: 4.92 x10E6/uL (ref 4.14–5.80)
RDW: 12.3 % (ref 11.6–15.4)
WBC: 5 10*3/uL (ref 3.4–10.8)

## 2020-07-04 LAB — COMPREHENSIVE METABOLIC PANEL
ALT: 21 IU/L (ref 0–44)
AST: 22 IU/L (ref 0–40)
Albumin/Globulin Ratio: 2.2 (ref 1.2–2.2)
Albumin: 5.1 g/dL (ref 4.1–5.2)
Alkaline Phosphatase: 75 IU/L (ref 51–125)
BUN/Creatinine Ratio: 14 (ref 9–20)
BUN: 14 mg/dL (ref 6–20)
Bilirubin Total: 0.6 mg/dL (ref 0.0–1.2)
CO2: 22 mmol/L (ref 20–29)
Calcium: 9.5 mg/dL (ref 8.7–10.2)
Chloride: 100 mmol/L (ref 96–106)
Creatinine, Ser: 0.99 mg/dL (ref 0.76–1.27)
GFR calc Af Amer: 126 mL/min/{1.73_m2} (ref >59)
GFR calc non Af Amer: 109 mL/min/{1.73_m2} (ref >59)
Globulin, Total: 2.3 g/dL (ref 1.5–4.5)
Glucose: 98 mg/dL (ref 65–99)
Potassium: 3.9 mmol/L (ref 3.5–5.2)
Sodium: 138 mmol/L (ref 134–144)
Total Protein: 7.4 g/dL (ref 6.0–8.5)

## 2020-07-04 LAB — TSH: TSH: 2.9 u[IU]/mL (ref 0.450–4.500)

## 2020-07-04 LAB — VITAMIN B12: Vitamin B-12: 445 pg/mL (ref 232–1245)

## 2020-07-04 LAB — VITAMIN D 25 HYDROXY (VIT D DEFICIENCY, FRACTURES): Vit D, 25-Hydroxy: 32.5 ng/mL (ref 30.0–100.0)

## 2020-07-06 ENCOUNTER — Encounter: Payer: Self-pay | Admitting: Family Medicine

## 2020-07-28 DIAGNOSIS — W19XXXA Unspecified fall, initial encounter: Secondary | ICD-10-CM | POA: Diagnosis not present

## 2020-07-28 DIAGNOSIS — R52 Pain, unspecified: Secondary | ICD-10-CM | POA: Diagnosis not present

## 2020-07-28 DIAGNOSIS — G4489 Other headache syndrome: Secondary | ICD-10-CM | POA: Diagnosis not present

## 2020-07-28 DIAGNOSIS — I10 Essential (primary) hypertension: Secondary | ICD-10-CM | POA: Diagnosis not present

## 2020-07-28 DIAGNOSIS — M25511 Pain in right shoulder: Secondary | ICD-10-CM | POA: Diagnosis not present

## 2020-07-28 DIAGNOSIS — M791 Myalgia, unspecified site: Secondary | ICD-10-CM | POA: Diagnosis not present

## 2020-07-28 DIAGNOSIS — M79662 Pain in left lower leg: Secondary | ICD-10-CM | POA: Diagnosis not present

## 2020-07-28 DIAGNOSIS — M542 Cervicalgia: Secondary | ICD-10-CM | POA: Diagnosis not present

## 2020-07-28 DIAGNOSIS — R531 Weakness: Secondary | ICD-10-CM | POA: Diagnosis not present

## 2020-09-01 DIAGNOSIS — R202 Paresthesia of skin: Secondary | ICD-10-CM | POA: Diagnosis not present

## 2020-09-01 DIAGNOSIS — M791 Myalgia, unspecified site: Secondary | ICD-10-CM | POA: Diagnosis not present

## 2020-09-01 DIAGNOSIS — M2550 Pain in unspecified joint: Secondary | ICD-10-CM | POA: Diagnosis not present

## 2020-09-11 DIAGNOSIS — M542 Cervicalgia: Secondary | ICD-10-CM | POA: Diagnosis not present

## 2020-09-11 DIAGNOSIS — M2550 Pain in unspecified joint: Secondary | ICD-10-CM | POA: Diagnosis not present

## 2020-09-11 DIAGNOSIS — R202 Paresthesia of skin: Secondary | ICD-10-CM | POA: Diagnosis not present

## 2020-09-11 DIAGNOSIS — M791 Myalgia, unspecified site: Secondary | ICD-10-CM | POA: Diagnosis not present

## 2020-10-05 DIAGNOSIS — M79601 Pain in right arm: Secondary | ICD-10-CM | POA: Diagnosis not present

## 2020-10-05 DIAGNOSIS — R202 Paresthesia of skin: Secondary | ICD-10-CM | POA: Diagnosis not present

## 2020-10-05 DIAGNOSIS — M79604 Pain in right leg: Secondary | ICD-10-CM | POA: Diagnosis not present

## 2020-10-07 DIAGNOSIS — M2550 Pain in unspecified joint: Secondary | ICD-10-CM | POA: Diagnosis not present

## 2020-10-07 DIAGNOSIS — M791 Myalgia, unspecified site: Secondary | ICD-10-CM | POA: Diagnosis not present

## 2020-10-07 DIAGNOSIS — R202 Paresthesia of skin: Secondary | ICD-10-CM | POA: Diagnosis not present

## 2020-12-10 DIAGNOSIS — R202 Paresthesia of skin: Secondary | ICD-10-CM | POA: Diagnosis not present

## 2020-12-10 DIAGNOSIS — M791 Myalgia, unspecified site: Secondary | ICD-10-CM | POA: Diagnosis not present

## 2020-12-10 DIAGNOSIS — M5481 Occipital neuralgia: Secondary | ICD-10-CM | POA: Diagnosis not present

## 2021-01-12 DIAGNOSIS — R9431 Abnormal electrocardiogram [ECG] [EKG]: Secondary | ICD-10-CM | POA: Diagnosis not present

## 2021-01-12 DIAGNOSIS — R0981 Nasal congestion: Secondary | ICD-10-CM | POA: Diagnosis not present

## 2021-01-12 DIAGNOSIS — F419 Anxiety disorder, unspecified: Secondary | ICD-10-CM | POA: Diagnosis not present

## 2021-01-12 DIAGNOSIS — R0789 Other chest pain: Secondary | ICD-10-CM | POA: Diagnosis not present

## 2021-01-12 DIAGNOSIS — B349 Viral infection, unspecified: Secondary | ICD-10-CM | POA: Diagnosis not present

## 2021-01-12 DIAGNOSIS — Z79899 Other long term (current) drug therapy: Secondary | ICD-10-CM | POA: Diagnosis not present

## 2021-01-12 DIAGNOSIS — R079 Chest pain, unspecified: Secondary | ICD-10-CM | POA: Diagnosis not present

## 2021-01-12 DIAGNOSIS — F32A Depression, unspecified: Secondary | ICD-10-CM | POA: Diagnosis not present

## 2021-01-12 DIAGNOSIS — R0602 Shortness of breath: Secondary | ICD-10-CM | POA: Diagnosis not present

## 2021-01-12 DIAGNOSIS — Z20822 Contact with and (suspected) exposure to covid-19: Secondary | ICD-10-CM | POA: Diagnosis not present

## 2021-01-13 ENCOUNTER — Telehealth: Payer: Self-pay

## 2021-01-13 NOTE — Telephone Encounter (Signed)
Transition Care Management Follow-up Telephone Call   Date discharged?    01/12/2021            How have you been since you were released from the hospital?  fine   Do you understand why you were in the hospital? yes   Do you understand the discharge instructions? yes   Where were you discharged to? home   Items Reviewed:  Medications reviewed:   Allergies reviewed:   Dietary changes reviewed:   Referrals reviewed:    Functional Questionnaire:   Activities of Daily Living (ADLs):  ok    Any transportation issues/concerns?:  no   Any patient concerns?  no   Confirmed importance and date/time of follow-up visits scheduled      Confirmed with patient if condition begins to worsen call PCP or go to the ER.  Patient was given the office number and encouraged to call back with question or concerns.  :   Appointment scheduled.

## 2021-01-13 NOTE — Telephone Encounter (Signed)
-----   Message from Vern Claude sent at 01/13/2021  7:35 AM EDT ----- Regarding: TOC Call for patient discharge Patient was discharged from [facility] on 01/12/2021. Patient sees Raliegh Ip as PCP and will need Transition of Care call and documentation with follow up visit scheduled with Crystal.  Thank you!

## 2021-01-29 ENCOUNTER — Other Ambulatory Visit: Payer: Self-pay

## 2021-01-29 ENCOUNTER — Ambulatory Visit (INDEPENDENT_AMBULATORY_CARE_PROVIDER_SITE_OTHER): Payer: Medicaid Other | Admitting: Nurse Practitioner

## 2021-01-29 ENCOUNTER — Encounter: Payer: Self-pay | Admitting: Nurse Practitioner

## 2021-01-29 VITALS — BP 103/56 | HR 95 | Temp 100.2°F | Ht 65.0 in | Wt 136.6 lb

## 2021-01-29 DIAGNOSIS — R059 Cough, unspecified: Secondary | ICD-10-CM | POA: Diagnosis not present

## 2021-01-29 DIAGNOSIS — F329 Major depressive disorder, single episode, unspecified: Secondary | ICD-10-CM | POA: Diagnosis not present

## 2021-01-29 DIAGNOSIS — F32A Depression, unspecified: Secondary | ICD-10-CM

## 2021-01-29 DIAGNOSIS — F419 Anxiety disorder, unspecified: Secondary | ICD-10-CM | POA: Diagnosis not present

## 2021-01-29 NOTE — Patient Instructions (Addendum)
Health Maintenance, Male Adopting a healthy lifestyle and getting preventive care are important in promoting health and wellness. Ask your health care provider about:  The right schedule for you to have regular tests and exams.  Things you can do on your own to prevent diseases and keep yourself healthy. What should I know about diet, weight, and exercise? Eat a healthy diet  Eat a diet that includes plenty of vegetables, fruits, low-fat dairy products, and lean protein.  Do not eat a lot of foods that are high in solid fats, added sugars, or sodium.   Maintain a healthy weight Body mass index (BMI) is a measurement that can be used to identify possible weight problems. It estimates body fat based on height and weight. Your health care provider can help determine your BMI and help you achieve or maintain a healthy weight. Get regular exercise Get regular exercise. This is one of the most important things you can do for your health. Most adults should:  Exercise for at least 150 minutes each week. The exercise should increase your heart rate and make you sweat (moderate-intensity exercise).  Do strengthening exercises at least twice a week. This is in addition to the moderate-intensity exercise.  Spend less time sitting. Even light physical activity can be beneficial. Watch cholesterol and blood lipids Have your blood tested for lipids and cholesterol at 21 years of age, then have this test every 5 years. You may need to have your cholesterol levels checked more often if:  Your lipid or cholesterol levels are high.  You are older than 21 years of age.  You are at high risk for heart disease. What should I know about cancer screening? Many types of cancers can be detected early and may often be prevented. Depending on your health history and family history, you may need to have cancer screening at various ages. This may include screening for:  Colorectal cancer.  Prostate  cancer.  Skin cancer.  Lung cancer. What should I know about heart disease, diabetes, and high blood pressure? Blood pressure and heart disease  High blood pressure causes heart disease and increases the risk of stroke. This is more likely to develop in people who have high blood pressure readings, are of African descent, or are overweight.  Talk with your health care provider about your target blood pressure readings.  Have your blood pressure checked: ? Every 3-5 years if you are 50-73 years of age. ? Every year if you are 55 years old or older.  If you are between the ages of 83 and 35 and are a current or former smoker, ask your health care provider if you should have a one-time screening for abdominal aortic aneurysm (AAA). Diabetes Have regular diabetes screenings. This checks your fasting blood sugar level. Have the screening done:  Once every three years after age 14 if you are at a normal weight and have a low risk for diabetes.  More often and at a younger age if you are overweight or have a high risk for diabetes. What should I know about preventing infection? Hepatitis B If you have a higher risk for hepatitis B, you should be screened for this virus. Talk with your health care provider to find out if you are at risk for hepatitis B infection. Hepatitis C Blood testing is recommended for:  Everyone born from [birth year] through 1966.  Anyone with known risk factors for hepatitis C. Sexually transmitted infections (STIs)  You should be screened each  year for STIs, including gonorrhea and chlamydia, if: ? You are sexually active and are younger than 21 years of age. ? You are older than 21 years of age and your health care provider tells you that you are at risk for this type of infection. ? Your sexual activity has changed since you were last screened, and you are at increased risk for chlamydia or gonorrhea. Ask your health care provider if you are at risk.  Ask your  health care provider about whether you are at high risk for HIV. Your health care provider may recommend a prescription medicine to help prevent HIV infection. If you choose to take medicine to prevent HIV, you should first get tested for HIV. You should then be tested every 3 months for as long as you are taking the medicine. Follow these instructions at home: Lifestyle  Do not use any products that contain nicotine or tobacco, such as cigarettes, e-cigarettes, and chewing tobacco. If you need help quitting, ask your health care provider.  Do not use street drugs.  Do not share needles.  Ask your health care provider for help if you need support or information about quitting drugs. Alcohol use  Do not drink alcohol if your health care provider tells you not to drink.  If you drink alcohol: ? Limit how much you have to 0-2 drinks a day. ? Be aware of how much alcohol is in your drink. In the [place] one drink equals one 12 oz bottle of beer (355 mL), one 5 oz glass of wine (148 mL), or one 1 oz glass of hard liquor (44 mL). General instructions  Schedule regular health, dental, and eye exams.  Stay current with your vaccines.  Tell your health care provider if: ? You often feel depressed. ? You have ever been abused or do not feel safe at home. Summary  Adopting a healthy lifestyle and getting preventive care are important in promoting health and wellness.  Follow your health care provider's instructions about healthy diet, exercising, and getting tested or screened for diseases.  Follow your health care provider's instructions on monitoring your cholesterol and blood pressure. This information is not intended to replace advice given to you by your health care provider. Make sure you discuss any questions you have with your health care provider. Document Revised: 09/12/2018 Document Reviewed: 09/12/2018 Elsevier Patient Education  2021 Elsevier Inc. Cough, Adult A cough helps to  clear your throat and lungs. A cough may be a sign of an illness or another medical condition. An acute cough may only last 2-3 weeks, while a chronic cough may last 8 or more weeks. Many things can cause a cough. They include:  Germs (viruses or bacteria) that attack the airway.  Breathing in things that bother (irritate) your lungs.  Allergies.  Asthma.  Mucus that runs down the back of your throat (postnasal drip).  Smoking.  Acid backing up from the stomach into the tube that moves food from the mouth to the stomach (gastroesophageal reflux).  Some medicines.  Lung problems.  Other medical conditions, such as heart failure or a blood clot in the lung (pulmonary embolism). Follow these instructions at home: Medicines  Take over-the-counter and prescription medicines only as told by your doctor.  Talk with your doctor before you take medicines that stop a cough (cough suppressants). Lifestyle  Do not smoke, and try not to be around smoke. Do not use any products that contain nicotine or tobacco, such as cigarettes,  e-cigarettes, and chewing tobacco. If you need help quitting, ask your doctor.  Drink enough fluid to keep your pee (urine) pale yellow.  Avoid caffeine.  Do not drink alcohol if your doctor tells you not to drink.   General instructions  Watch for any changes in your cough. Tell your doctor about them.  Always cover your mouth when you cough.  Stay away from things that make you cough, such as perfume, candles, campfire smoke, or cleaning products.  If the air is dry, use a cool mist vaporizer or humidifier in your home.  If your cough is worse at night, try using extra pillows to raise your head up higher while you sleep.  Rest as needed.  Keep all follow-up visits as told by your doctor. This is important.   Contact a doctor if:  You have new symptoms.  You cough up pus.  Your cough does not get better after 2-3 weeks, or your cough gets  worse.  Cough medicine does not help your cough and you are not sleeping well.  You have pain that gets worse or pain that is not helped with medicine.  You have a fever.  You are losing weight and you do not know why.  You have night sweats. Get help right away if:  You cough up blood.  You have trouble breathing.  Your heartbeat is very fast. These symptoms may be an emergency. Do not wait to see if the symptoms will go away. Get medical help right away. Call your local emergency services (911 in the [place] Do not drive yourself to the hospital. Summary  A cough helps to clear your throat and lungs. Many things can cause a cough.  Take over-the-counter and prescription medicines only as told by your doctor.  Always cover your mouth when you cough.  Contact a doctor if you have new symptoms or you have a cough that does not get better or gets worse. This information is not intended to replace advice given to you by your health care provider. Make sure you discuss any questions you have with your health care provider. Document Revised: 11/08/2019 Document Reviewed: 10/08/2018 Elsevier Patient Education  2021 [employer].

## 2021-01-29 NOTE — Progress Notes (Signed)
[facility] Patient [facility] [address] [place], [place]  [address] Phone:  [phone]   Fax:  [phone]   Established Patient Office Visit  Subjective:  Patient ID: Phillip Hopkins, male    DOB: Jun 03, 2001  Age: 20 y.o. MRN: [id number]  CC:  Chief Complaint  Patient presents with  . Hospitalization Follow-up    In the hospital for sob , headaches , given steroids , still having dry coughing , no more headaches , still having chest pain off and on     HPI Aldyn Toon presents for ER follow up. He  has a past medical history of Acute bronchitis, Anxiety (2019), Cough, Generalized joint pain, Migraines, and Upper respiratory tract infection.   He was seen on 01/12/21 for a viral infection; treated with steroids. He states that he feels better mostly but has a constant dry cough. He is treating this with Benzonatate 100 mg. He was having some wheezing and the albuterol has been effective. This was last used this morning. He is using this 2-3 times per day q6 hours.  He has been in areas with increased pollen amounts and no symptom change. He also did not have any additional symptoms.   He was referred to neurology and continues to follow up. He is having optic nerve pain. He is currently being treated. He has occasional blurred vision and eye pain. He has had vision test at school. He admits that he dry cough started when the numbness and tingling started.    Past Medical History:  Diagnosis Date  . Acute bronchitis   . Anxiety 2019  . Cough   . Generalized joint pain   . Migraines   . Upper respiratory tract infection     History reviewed. No pertinent surgical history.  History reviewed. No pertinent family history.  Social History   Socioeconomic History  . Marital status: Single    Spouse name: Not on file  . Number of children: Not on file  . Years of education: Not on file  . Highest education level: Not on file  Occupational History  . Not on file  Tobacco Use   . Smoking status: Never Smoker  . Smokeless tobacco: Never Used  Substance and Sexual Activity  . Alcohol use: Never  . Drug use: Never  . Sexual activity: Not on file  Other Topics Concern  . Not on file  Social History Narrative  . Not on file   Social Determinants of Health   Financial Resource Strain: Not on file  Food Insecurity: Not on file  Transportation Needs: Not on file  Physical Activity: Not on file  Stress: Not on file  Social Connections: Not on file  Intimate Partner Violence: Not on file    Outpatient Medications Prior to Visit  Medication Sig Dispense Refill  . albuterol (VENTOLIN HFA) 108 (90 Base) MCG/ACT inhaler Inhale into the lungs.    . benzonatate (TESSALON) 100 MG capsule Take by mouth.    . fluticasone (FLONASE) 50 MCG/ACT nasal spray 2 sprays into each nostril daily.    Marland Kitchen gabapentin (NEURONTIN) 100 MG capsule Take by mouth.    . Spacer/Aero-Holding Chambers (EASIVENT) inhaler Use with each inhaler use.    . nortriptyline (PAMELOR) 10 MG capsule Take 20 mg by mouth at bedtime.  (Patient not taking: No sig reported)    . venlafaxine (EFFEXOR) 37.5 MG tablet Take by mouth. (Patient not taking: Reported on 07/03/2020)     No facility-administered  medications prior to visit.    No Known Allergies  ROS Review of Systems  Constitutional: Negative for chills and fatigue.       2-3 meals  He drinks water mainly   Eyes: Negative.   Respiratory: Negative for shortness of breath.   Gastrointestinal: Negative for constipation, diarrhea and nausea.  Endocrine: Negative.   Musculoskeletal: Positive for joint swelling.  Allergic/Immunologic: Negative.   Neurological: Positive for numbness.      Objective:    Physical Exam Constitutional:      Appearance: He is normal weight.  HENT:     Head: Normocephalic and atraumatic.     Mouth/Throat:     Mouth: Mucous membranes are moist.  Cardiovascular:     Rate and Rhythm: Normal rate and regular  rhythm.     Pulses: Normal pulses.     Heart sounds: Normal heart sounds.  Pulmonary:     Effort: Pulmonary effort is normal.     Breath sounds: Normal breath sounds.  Musculoskeletal:        General: Normal range of motion.     Cervical back: Normal range of motion.  Skin:    General: Skin is warm and dry.     Capillary Refill: Capillary refill takes less than 2 seconds.  Neurological:     General: No focal deficit present.     Mental Status: He is alert and oriented to person, place, and time.  Psychiatric:        Mood and Affect: Mood normal.        Behavior: Behavior normal.        Thought Content: Thought content normal.        Judgment: Judgment normal.     BP (!) 103/56 (BP Location: Left Arm, Patient Position: Sitting, Cuff Size: Normal)   Pulse 95   Temp 100.2 F (37.9 C) (Temporal)   Ht 5\' 5"  (1.651 m)   Wt 136 lb 9.6 oz (62 kg)   SpO2 99%   BMI 22.73 kg/m  Wt Readings from Last 3 Encounters:  01/29/21 136 lb 9.6 oz (62 kg)  07/03/20 139 lb 3.2 oz (63.1 kg)  12/02/19 139 lb (63 kg) (24 %, Z= -0.72)*   * Growth percentiles are based on CDC (Boys, 2-20 Years) data.     Health Maintenance Due  Topic Date Due  . Hepatitis C Screening  Never done  . HPV VACCINES (1 - Male 2-dose series) Never done  . HIV Screening  Never done       Topic Date Due  . HPV VACCINES (1 - Male 2-dose series) Never done    Lab Results  Component Value Date   TSH 2.900 07/03/2020   Lab Results  Component Value Date   WBC 5.0 07/03/2020   HGB 14.9 07/03/2020   HCT 44.0 07/03/2020   MCV 89 07/03/2020   PLT 236 07/03/2020   Lab Results  Component Value Date   NA 138 07/03/2020   K 3.9 07/03/2020   CO2 22 07/03/2020   GLUCOSE 98 07/03/2020   BUN 14 07/03/2020   CREATININE 0.99 07/03/2020   BILITOT 0.6 07/03/2020   ALKPHOS 75 07/03/2020   AST 22 07/03/2020   ALT 21 07/03/2020   PROT 7.4 07/03/2020   ALBUMIN 5.1 07/03/2020   CALCIUM 9.5 07/03/2020   ANIONGAP 12  07/08/2019   No results found for: CHOL No results found for: HDL No results found for: LDLCALC No results found for: TRIG No results  found for: [facility] Lab Results  Component Value Date   HGBA1C 5.3 10/26/2018      Assessment & Plan:   Problem List Items Addressed This Visit      Other   Anxiety Persistent no treatment    Depressive disorder    Other Visit Diagnoses    Cough    -  Primary Persistent  Treatment no effective Further evaluation requested (pulmonology in the [facility] area waiting on information from the patient)       No orders of the defined types were placed in this encounter.   Follow-up: Return for follow up as needed.    Barbette Merino, NP

## 2021-02-15 DIAGNOSIS — M791 Myalgia, unspecified site: Secondary | ICD-10-CM | POA: Diagnosis not present

## 2021-02-15 DIAGNOSIS — M5481 Occipital neuralgia: Secondary | ICD-10-CM | POA: Diagnosis not present

## 2021-02-15 DIAGNOSIS — M79604 Pain in right leg: Secondary | ICD-10-CM | POA: Diagnosis not present

## 2021-02-15 DIAGNOSIS — M2550 Pain in unspecified joint: Secondary | ICD-10-CM | POA: Diagnosis not present

## 2021-05-04 DIAGNOSIS — M791 Myalgia, unspecified site: Secondary | ICD-10-CM | POA: Diagnosis not present

## 2021-05-04 DIAGNOSIS — R202 Paresthesia of skin: Secondary | ICD-10-CM | POA: Diagnosis not present

## 2021-05-04 DIAGNOSIS — M2550 Pain in unspecified joint: Secondary | ICD-10-CM | POA: Diagnosis not present

## 2021-05-04 DIAGNOSIS — M5481 Occipital neuralgia: Secondary | ICD-10-CM | POA: Diagnosis not present

## 2021-06-09 DIAGNOSIS — R202 Paresthesia of skin: Secondary | ICD-10-CM | POA: Diagnosis not present

## 2021-06-09 DIAGNOSIS — M064 Inflammatory polyarthropathy: Secondary | ICD-10-CM | POA: Diagnosis not present

## 2021-06-09 DIAGNOSIS — M5481 Occipital neuralgia: Secondary | ICD-10-CM | POA: Diagnosis not present

## 2021-06-09 DIAGNOSIS — D899 Disorder involving the immune mechanism, unspecified: Secondary | ICD-10-CM | POA: Diagnosis not present

## 2021-06-09 DIAGNOSIS — M799 Soft tissue disorder, unspecified: Secondary | ICD-10-CM | POA: Diagnosis not present

## 2021-07-05 ENCOUNTER — Ambulatory Visit: Payer: Self-pay | Admitting: Nurse Practitioner

## 2021-07-16 DIAGNOSIS — Z23 Encounter for immunization: Secondary | ICD-10-CM | POA: Diagnosis not present

## 2021-08-02 DIAGNOSIS — M5481 Occipital neuralgia: Secondary | ICD-10-CM | POA: Diagnosis not present

## 2021-08-02 DIAGNOSIS — M79604 Pain in right leg: Secondary | ICD-10-CM | POA: Diagnosis not present

## 2021-08-02 DIAGNOSIS — R202 Paresthesia of skin: Secondary | ICD-10-CM | POA: Diagnosis not present

## 2021-08-02 DIAGNOSIS — M791 Myalgia, unspecified site: Secondary | ICD-10-CM | POA: Diagnosis not present

## 2023-06-09 DIAGNOSIS — F649 Gender identity disorder, unspecified: Secondary | ICD-10-CM | POA: Diagnosis not present

## 2023-09-08 DIAGNOSIS — Z114 Encounter for screening for human immunodeficiency virus [HIV]: Secondary | ICD-10-CM | POA: Diagnosis not present

## 2023-09-08 DIAGNOSIS — F649 Gender identity disorder, unspecified: Secondary | ICD-10-CM | POA: Diagnosis not present

## 2023-09-08 DIAGNOSIS — Z113 Encounter for screening for infections with a predominantly sexual mode of transmission: Secondary | ICD-10-CM | POA: Diagnosis not present

## 2023-11-03 DIAGNOSIS — Z13220 Encounter for screening for lipoid disorders: Secondary | ICD-10-CM | POA: Diagnosis not present

## 2023-11-03 DIAGNOSIS — Z114 Encounter for screening for human immunodeficiency virus [HIV]: Secondary | ICD-10-CM | POA: Diagnosis not present

## 2023-11-03 DIAGNOSIS — Z113 Encounter for screening for infections with a predominantly sexual mode of transmission: Secondary | ICD-10-CM | POA: Diagnosis not present

## 2023-11-03 DIAGNOSIS — Z2981 Encounter for HIV pre-exposure prophylaxis: Secondary | ICD-10-CM | POA: Diagnosis not present

## 2023-11-03 DIAGNOSIS — Z206 Contact with and (suspected) exposure to human immunodeficiency virus [HIV]: Secondary | ICD-10-CM | POA: Diagnosis not present

## 2023-11-03 DIAGNOSIS — Z13228 Encounter for screening for other metabolic disorders: Secondary | ICD-10-CM | POA: Diagnosis not present

## 2023-11-03 DIAGNOSIS — Z717 Human immunodeficiency virus [HIV] counseling: Secondary | ICD-10-CM | POA: Diagnosis not present

## 2023-12-29 DIAGNOSIS — F649 Gender identity disorder, unspecified: Secondary | ICD-10-CM | POA: Diagnosis not present
